# Patient Record
Sex: Male | Born: 1980 | Race: Black or African American | Hispanic: No | Marital: Single | Smoking: Current every day smoker
Health system: Southern US, Community
[De-identification: ages and names within clinical notes are randomized; demographics above are authoritative.]

## PROBLEM LIST (undated history)

## (undated) HISTORY — PX: FOOT SURGERY: SHX648

---

## 2011-08-20 ENCOUNTER — Emergency Department (INDEPENDENT_AMBULATORY_CARE_PROVIDER_SITE_OTHER)
Admission: EM | Admit: 2011-08-20 | Discharge: 2011-08-20 | Disposition: A | Discharge status: Home Health | Payer: Self-pay | Source: Home / Self Care | Attending: Family Medicine | Admitting: Family Medicine

## 2011-08-20 ENCOUNTER — Encounter (HOSPITAL_COMMUNITY): Payer: Self-pay

## 2011-08-20 DIAGNOSIS — S239XXA Sprain of unspecified parts of thorax, initial encounter: Secondary | ICD-10-CM

## 2011-08-20 DIAGNOSIS — S29012A Strain of muscle and tendon of back wall of thorax, initial encounter: Secondary | ICD-10-CM

## 2011-08-20 MED ORDER — CYCLOBENZAPRINE HCL 5 MG PO TABS: 5.0000 mg | ORAL_TABLET | Freq: Three times a day (TID) | ORAL | Status: AC | PRN

## 2011-08-20 MED ORDER — IBUPROFEN 800 MG PO TABS: 800.0000 mg | ORAL_TABLET | Freq: Three times a day (TID) | ORAL | Status: AC

## 2011-08-20 NOTE — ED Notes (Signed)
Pt states has been having rt sided back pain since lifting items on 08-17-11.  States he has injured his back previously.

## 2011-08-20 NOTE — ED Provider Notes (Signed)
History     CSN: [id number]  Arrival date & time 08/20/11  1155   First MD Initiated Contact with Patient 08/20/11 1257      Chief Complaint  Patient presents with  . Back Pain    (Consider location/radiation/quality/duration/timing/severity/associated sxs/prior treatment) Patient is a 31 y.o. male presenting with back pain. The history is provided by the patient.  Back Pain  This is a new problem. The current episode started 2 days ago. The problem has not changed since onset.The pain is associated with lifting heavy objects. The pain is present in the lumbar spine. The quality of the pain is described as stabbing. The symptoms are aggravated by bending, twisting and certain positions. Pertinent negatives include no chest pain, no numbness, no abdominal pain, no abdominal swelling, no bowel incontinence, no dysuria, no paresthesias, no tingling and no weakness. Risk factors include obesity.    History reviewed. No pertinent past medical history.  History reviewed. No pertinent past surgical history.  No family history on file.  History  Substance Use Topics  . Smoking status: Current Everyday Smoker -- 1.0 packs/day  . Smokeless tobacco: Not on file  . Alcohol Use: Yes      Review of Systems  Constitutional: Negative.   Cardiovascular: Negative for chest pain.  Gastrointestinal: Negative.  Negative for abdominal pain and bowel incontinence.  Genitourinary: Negative for dysuria.  Musculoskeletal: Positive for back pain. Negative for arthralgias and gait problem.  Neurological: Negative for tingling, weakness, numbness and paresthesias.    Allergies  Review of patient's allergies indicates no known allergies.  Home Medications   Current Outpatient Rx  Name Route Sig Dispense Refill  . CYCLOBENZAPRINE HCL 5 MG PO TABS Oral Take 1 tablet (5 mg total) by mouth 3 (three) times daily as needed for muscle spasms. 30 tablet 0  . IBUPROFEN 800 MG PO TABS Oral Take 1 tablet  (800 mg total) by mouth 3 (three) times daily. 30 tablet 0    BP 103/68  Pulse 82  Temp(Src) 98 F (36.7 C) (Oral)  Resp 16  SpO2 99%  Physical Exam  Nursing note and vitals reviewed. Constitutional: He is oriented to person, place, and time. He appears well-developed and well-nourished.  HENT:  Head: Normocephalic.  Abdominal: Soft. Bowel sounds are normal. There is no tenderness.  Musculoskeletal:       Back:  Neurological: He is alert and oriented to person, place, and time.  Skin: Skin is warm and dry.    ED Course  Procedures (including critical care time)  Labs Reviewed - No data to display No results found.   1. Strain of mid-back       MDM          Barkley Bruns, MD 08/20/11 [phone]

## 2011-08-23 ENCOUNTER — Encounter (HOSPITAL_COMMUNITY): Payer: Self-pay | Admitting: *Deleted

## 2011-08-23 ENCOUNTER — Emergency Department (HOSPITAL_COMMUNITY)
Admission: EM | Admit: 2011-08-23 | Discharge: 2011-08-23 | Disposition: A | Discharge status: Home Health | Payer: Self-pay | Attending: Emergency Medicine | Admitting: Emergency Medicine

## 2011-08-23 DIAGNOSIS — Z79899 Other long term (current) drug therapy: Secondary | ICD-10-CM | POA: Insufficient documentation

## 2011-08-23 DIAGNOSIS — K089 Disorder of teeth and supporting structures, unspecified: Secondary | ICD-10-CM | POA: Insufficient documentation

## 2011-08-23 DIAGNOSIS — K0889 Other specified disorders of teeth and supporting structures: Secondary | ICD-10-CM

## 2011-08-23 DIAGNOSIS — R6884 Jaw pain: Secondary | ICD-10-CM | POA: Insufficient documentation

## 2011-08-23 DIAGNOSIS — F17200 Nicotine dependence, unspecified, uncomplicated: Secondary | ICD-10-CM | POA: Insufficient documentation

## 2011-08-23 DIAGNOSIS — H9209 Otalgia, unspecified ear: Secondary | ICD-10-CM | POA: Insufficient documentation

## 2011-08-23 MED ORDER — TRAMADOL HCL 50 MG PO TABS: 50.0000 mg | ORAL_TABLET | Freq: Three times a day (TID) | ORAL | Status: AC | PRN

## 2011-08-23 MED ORDER — PENICILLIN V POTASSIUM 500 MG PO TABS: 500.0000 mg | ORAL_TABLET | Freq: Three times a day (TID) | ORAL | Status: AC

## 2011-08-23 MED ORDER — TRAMADOL HCL 50 MG PO TABS
50.0000 mg | ORAL_TABLET | Freq: Once | ORAL | Status: AC
  Administered 2011-08-23: 50 mg via ORAL
  Filled 2011-08-23: qty 1

## 2011-08-23 NOTE — ED Provider Notes (Signed)
History     CSN: [id number]  Arrival date & time 08/23/11  7829   First MD Initiated Contact with Patient 08/23/11 1033      Chief Complaint  Patient presents with  . Dental Pain  . Otalgia    (Consider location/radiation/quality/duration/timing/severity/associated sxs/prior treatment) Patient is a 31 y.o. male presenting with tooth pain and ear pain. The history is provided by the patient.  Dental PainThe primary symptoms include mouth pain. The symptoms began 2 days ago. The symptoms are worsening. The symptoms are new. The symptoms occur constantly.  Additional symptoms include: ear pain. Additional symptoms do not include: facial swelling. Associated symptoms comments: Jaw pain on left radiating into left ear. No fever..   Otalgia Pertinent negatives include no vomiting.    History reviewed. No pertinent past medical history.  History reviewed. No pertinent past surgical history.  No family history on file.  History  Substance Use Topics  . Smoking status: Current Everyday Smoker -- 1.0 packs/day  . Smokeless tobacco: Not on file  . Alcohol Use: Yes      Review of Systems  Constitutional: Negative.   HENT: Positive for ear pain and dental problem. Negative for facial swelling.   Eyes: Negative.   Gastrointestinal: Negative.  Negative for nausea and vomiting.  Skin: Negative.     Allergies  Review of patient's allergies indicates no known allergies.  Home Medications   Current Outpatient Rx  Name Route Sig Dispense Refill  . CYCLOBENZAPRINE HCL 5 MG PO TABS Oral Take 1 tablet (5 mg total) by mouth 3 (three) times daily as needed for muscle spasms. 30 tablet 0  . IBUPROFEN 800 MG PO TABS Oral Take 1 tablet (800 mg total) by mouth 3 (three) times daily. 30 tablet 0    BP 151/91  Pulse 104  Temp(Src) 99.9 F (37.7 C) (Oral)  Resp 24  SpO2 99%  Physical Exam  Constitutional: He is oriented to person, place, and time. He appears well-developed and  well-nourished.  HENT:       Generally good dentition. Tender to lower alveolar ridge without palpable abscess. Left ear unremarkable in appearance. No cervical lymphadenopathy.  Neck: Normal range of motion.  Pulmonary/Chest: Effort normal.  Musculoskeletal: Normal range of motion.  Neurological: He is alert and oriented to person, place, and time.  Skin: Skin is warm and dry.  Psychiatric: He has a normal mood and affect.    ED Course  Procedures (including critical care time)  Labs Reviewed - No data to display No results found.   No diagnosis found.    MDM          Rodena Medin, PA-C 08/23/11 1121

## 2011-08-23 NOTE — Discharge Instructions (Signed)
FOLLOW UP WITH THE DENTIST OF YOUR CHOICE FOR FURTHER MANAGEMENT OF DENTAL PAIN. CONTINUE IBUPROFEN 800 MG GIVEN TO YOU ON Friday, MARCH1ST. TAKE ULTRAM FOR ADDITIONAL PAIN CONTROL.   Dental Pain A tooth ache may be caused by cavities (tooth decay). Cavities expose the nerve of the tooth to air and hot or cold temperatures. It may come from an infection or abscess (also called a boil or furuncle) around your tooth. It is also often caused by dental caries (tooth decay). This causes the pain you are having. DIAGNOSIS  Your caregiver can diagnose this problem by exam. TREATMENT   If caused by an infection, it may be treated with medications which kill germs (antibiotics) and pain medications as prescribed by your caregiver. Take medications as directed.   Only take over-the-counter or prescription medicines for pain, discomfort, or fever as directed by your caregiver.   Whether the tooth ache today is caused by infection or dental disease, you should see your dentist as soon as possible for further care.  SEEK MEDICAL CARE IF: The exam and treatment you received today has been provided on an emergency basis only. This is not a substitute for complete medical or dental care. If your problem worsens or new problems (symptoms) appear, and you are unable to meet with your dentist, call or return to this location. SEEK IMMEDIATE MEDICAL CARE IF:   You have a fever.   You develop redness and swelling of your face, jaw, or neck.   You are unable to open your mouth.   You have severe pain uncontrolled by pain medicine.  MAKE SURE YOU:   Understand these instructions.   Will watch your condition.   Will get help right away if you are not doing well or get worse.  Document Released: 06/07/2005 Document Revised: 05/27/2011 Document Reviewed: 01/24/2008 [facility] Patient Information 2012 [place], [place].

## 2011-08-23 NOTE — ED Notes (Signed)
Patient reports onset of toothache on Friday on the left side.  He is now having ear pain on same side.  Patient has some swelling noted to the left side of his face

## 2011-08-23 NOTE — ED Provider Notes (Signed)
Medical screening examination/treatment/procedure(s) were performed by non-physician practitioner and as supervising physician I was immediately available for consultation/collaboration.  Doug Sou, MD 08/23/11 1744

## 2011-10-02 ENCOUNTER — Emergency Department (HOSPITAL_COMMUNITY)
Admission: EM | Admit: 2011-10-02 | Discharge: 2011-10-02 | Disposition: A | Discharge status: Home / Self Care | Payer: Self-pay | Attending: Emergency Medicine | Admitting: Emergency Medicine

## 2011-10-02 ENCOUNTER — Encounter (HOSPITAL_COMMUNITY): Payer: Self-pay

## 2011-10-02 DIAGNOSIS — F17200 Nicotine dependence, unspecified, uncomplicated: Secondary | ICD-10-CM | POA: Insufficient documentation

## 2011-10-02 DIAGNOSIS — K047 Periapical abscess without sinus: Secondary | ICD-10-CM | POA: Insufficient documentation

## 2011-10-02 MED ORDER — OXYCODONE-ACETAMINOPHEN 5-325 MG PO TABS
1.0000 | ORAL_TABLET | Freq: Once | ORAL | Status: AC
  Administered 2011-10-02: 1 via ORAL
  Filled 2011-10-02: qty 1

## 2011-10-02 MED ORDER — OXYCODONE-ACETAMINOPHEN 5-325 MG PO TABS: 1.0000 | ORAL_TABLET | Freq: Four times a day (QID) | ORAL | Status: AC | PRN

## 2011-10-02 MED ORDER — PENICILLIN V POTASSIUM 500 MG PO TABS: 500.0000 mg | ORAL_TABLET | Freq: Four times a day (QID) | ORAL | Status: AC

## 2011-10-02 NOTE — Discharge Instructions (Signed)
Dental Abscess A dental abscess usually starts from an infected tooth. Antibiotic medicine and pain pills can be helpful, but dental infections require the attention of a dentist. Rinse around the infected area often with salt water (a pinch of salt in 8 oz of warm water). Do not apply heat to the outside of your face. See your dentist or oral surgeon as soon as possible.  SEEK IMMEDIATE MEDICAL CARE IF:  You have increasing, severe pain that is not relieved by medicine.   You or your child has an oral temperature above 102 F (38.9 C), not controlled by medicine.   Your baby is older than 3 months with a rectal temperature of 102 F (38.9 C) or higher.   Your baby is 3 months old or younger with a rectal temperature of 100.4 F (38 C) or higher.   You develop chills, severe headache, difficulty breathing, or trouble swallowing.   You have swelling in the neck or around the eye.  Document Released: 06/07/2005 Document Revised: 05/27/2011 Document Reviewed: 11/16/2006 [employer] Patient Information 2012 [employer]. 

## 2011-10-02 NOTE — ED Provider Notes (Signed)
History     CSN: [id number]  Arrival date & time 10/02/11  1032   First MD Initiated Contact with Patient 10/02/11 1104      Chief Complaint  Patient presents with  . Facial Swelling    (Consider location/radiation/quality/duration/timing/severity/associated sxs/prior treatment) The history is provided by the patient.   patient was left facial pain and swelling from a bad tooth. Begin this Thursday, but has been seen in ER for the same previously. He states he was on penicillin, but did not finish it. No fevers. No trauma. He states he does not have a dentist.  No past medical history on file.  History reviewed. No pertinent past surgical history.  No family history on file.  History  Substance Use Topics  . Smoking status: Current Everyday Smoker -- 1.0 packs/day  . Smokeless tobacco: Not on file  . Alcohol Use: Yes      Review of Systems  Constitutional: Negative for fever and fatigue.  HENT: Positive for dental problem. Negative for sore throat and voice change.   Respiratory: Negative for shortness of breath.     Allergies  Review of patient's allergies indicates no known allergies.  Home Medications   Current Outpatient Rx  Name Route Sig Dispense Refill  . OXYCODONE-ACETAMINOPHEN 5-325 MG PO TABS Oral Take 1-2 tablets by mouth every 6 (six) hours as needed for pain. 10 tablet 0  . PENICILLIN V POTASSIUM 500 MG PO TABS Oral Take 1 tablet (500 mg total) by mouth 4 (four) times daily. 40 tablet 0    BP 133/80  Pulse 89  Temp(Src) 98.2 F (36.8 C) (Oral)  Resp 18  SpO2 99%  Physical Exam  Constitutional: He appears well-developed.  HENT:       Swelling and fluctuance the left lower jaw laterally at approximately the fifth tooth. Poor dentition a broken off teeth at the gumline posterior to this. No swelling of floor of mouth. No skin changes    ED Course  Dental Date/Time: 10/02/2011 11:27 AM Performed by: Benjiman Core R. Authorized by:  Billee Cashing Consent: Verbal consent obtained. Risks and benefits: risks, benefits and alternatives were discussed Consent given by: patient Patient understanding: patient states understanding of the procedure being performed Patient consent: the patient's understanding of the procedure matches consent given Procedure consent: procedure consent matches procedure scheduled Relevant documents: relevant documents present and verified Test results: test results available and properly labeled Site marked: the operative site was marked Imaging studies: imaging studies not available Required items: required blood products, implants, devices, and special equipment available Patient identity confirmed: verbally with patient and arm band Time out: Immediately prior to procedure a "time out" was called to verify the correct patient, procedure, equipment, support staff and site/side marked as required. Local anesthesia used: yes Anesthesia: local infiltration Local anesthetic: lidocaine 2% with epinephrine Anesthetic total: 1 ml Patient sedated: no Patient tolerance: Patient tolerated the procedure well with no immediate complications. Comments: Dental abscess was anesthetized using lidocaine with epinephrine. A single puncture wound is made with an 11 blade scalpel. Purulent drainage was expressed. No complications   (including critical care time)  Labs Reviewed - No data to display No results found.   1. Dental abscess       MDM  Patient with a dental abscess. Has been on antibiotics, but states he did not finish them. Abscess was drained. He'll followup with a dentist.        Juliet Rude. Rubin Payor, MD 10/02/11 1130

## 2011-10-02 NOTE — ED Notes (Signed)
Lt. Lower dental pain with swelling.  Began on Thursday.

## 2012-01-08 ENCOUNTER — Encounter (HOSPITAL_COMMUNITY): Payer: Self-pay | Admitting: *Deleted

## 2012-01-08 ENCOUNTER — Emergency Department (HOSPITAL_COMMUNITY)
Admission: EM | Admit: 2012-01-08 | Discharge: 2012-01-08 | Disposition: A | Discharge status: Home / Self Care | Payer: Self-pay | Attending: Emergency Medicine | Admitting: Emergency Medicine

## 2012-01-08 DIAGNOSIS — F17200 Nicotine dependence, unspecified, uncomplicated: Secondary | ICD-10-CM | POA: Insufficient documentation

## 2012-01-08 DIAGNOSIS — J020 Streptococcal pharyngitis: Secondary | ICD-10-CM | POA: Insufficient documentation

## 2012-01-08 DIAGNOSIS — J069 Acute upper respiratory infection, unspecified: Secondary | ICD-10-CM

## 2012-01-08 MED ORDER — AZITHROMYCIN 250 MG PO TABS: 250.0000 mg | ORAL_TABLET | Freq: Every day | ORAL | Status: AC

## 2012-01-08 NOTE — ED Notes (Signed)
Reports sore throat x 2 days, causing sob. Airway is intact at triage.

## 2012-01-08 NOTE — ED Provider Notes (Signed)
History  Scribed for Edwin Jakes, MD, the patient was seen in room TR08C/TR08C. This chart was scribed by Edwin Palmer. The patient's care started at 11:48 AM     CSN: [id number]  Arrival date & time 01/08/12  1045   First MD Initiated Contact with Patient 01/08/12 1125      Chief Complaint  Patient presents with  . Sore Throat     The history is provided by the patient.   Edwin Palmer is a 31 y.o. male who presents to the Emergency Department complaining of sore throat that started two days ago.  Pt states that he is also experiencing SOB, productive cough, congestion, nausea, vomiting, diarrhea which he reports occurs twice a day.  He denies fever, dysuria, or abdominal pain.  Pt reports he has been in contact with someone who was dx with strep throat.  Nothing seems to make the sx better or worse.    History reviewed. No pertinent past medical history.  History reviewed. No pertinent past surgical history.  History reviewed. No pertinent family history.  History  Substance Use Topics  . Smoking status: Current Everyday Smoker -- 1.0 packs/day  . Smokeless tobacco: Not on file  . Alcohol Use: Yes      Review of Systems  Constitutional: Negative for fever.  HENT: Positive for congestion and sore throat.   Eyes: Negative for visual disturbance.  Respiratory: Positive for cough and shortness of breath.   Cardiovascular: Negative for chest pain.  Gastrointestinal: Positive for nausea, vomiting and diarrhea. Negative for abdominal pain.  Genitourinary: Negative for dysuria.  Skin: Negative for rash.  All other systems reviewed and are negative.    Allergies  Review of patient's allergies indicates no known allergies.  Home Medications   Current Outpatient Rx  Name Route Sig Dispense Refill  . IBUPROFEN 200 MG PO TABS Oral Take 200 mg by mouth every 6 (six) hours as needed. For pain    . AZITHROMYCIN 250 MG PO TABS Oral Take 1 tablet (250 mg total) by  mouth daily. Take first 2 tablets together, then 1 every day until finished. 6 tablet 0    BP 129/68  Pulse 92  Temp 98.3 F (36.8 C) (Oral)  Resp 20  SpO2 98%  Physical Exam  Nursing note and vitals reviewed. Constitutional: He is oriented to person, place, and time. He appears well-developed and well-nourished. No distress.  HENT:  Head: Normocephalic and atraumatic.  Mouth/Throat: Oropharyngeal exudate present.       Throat erythematous.   Eyes: EOM are normal. Pupils are equal, round, and reactive to light. No scleral icterus.  Cardiovascular: Normal rate and regular rhythm.   Pulmonary/Chest: Breath sounds normal.  Abdominal: There is no tenderness.  Musculoskeletal: Normal range of motion. He exhibits no tenderness.  Lymphadenopathy:    He has no cervical adenopathy.  Neurological: He is alert and oriented to person, place, and time.  Skin: Skin is warm and dry. No rash noted. He is not diaphoretic.  Psychiatric: He has a normal mood and affect. His behavior is normal.    ED Course  Procedures  DIAGNOSTIC STUDIES: Oxygen Saturation is 98% on room air, normal by my interpretation.    COORDINATION OF CARE:  11:02AM Ordered: Rapid strep screen   Labs Reviewed  RAPID STREP SCREEN - Abnormal; Notable for the following:    Streptococcus, Group A Screen (Direct) POSITIVE (*)     All other components within normal limits   No results  found.   1. Strep pharyngitis   2. Upper respiratory infection       MDM  Patient's strep test positive also patient with some shortness of breath productive cough we'll treat with Zithromax in case it's a pneumonia based illness as well. 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  I personally performed the services described in this documentation, which was scribed in my presence. The recorded information has been reviewed and considered.         Edwin Jakes, MD 01/08/12 [phone]

## 2012-01-18 ENCOUNTER — Encounter (HOSPITAL_COMMUNITY): Payer: Self-pay | Admitting: Emergency Medicine

## 2012-01-18 ENCOUNTER — Emergency Department (HOSPITAL_COMMUNITY)
Admission: EM | Admit: 2012-01-18 | Discharge: 2012-01-18 | Disposition: A | Discharge status: Home Health | Payer: Self-pay | Attending: Emergency Medicine | Admitting: Emergency Medicine

## 2012-01-18 DIAGNOSIS — J029 Acute pharyngitis, unspecified: Secondary | ICD-10-CM | POA: Insufficient documentation

## 2012-01-18 DIAGNOSIS — F17200 Nicotine dependence, unspecified, uncomplicated: Secondary | ICD-10-CM | POA: Insufficient documentation

## 2012-01-18 MED ORDER — PENICILLIN G BENZATHINE 1200000 UNIT/2ML IM SUSP
1.2000 10*6.[IU] | Freq: Once | INTRAMUSCULAR | Status: AC
  Administered 2012-01-18: 1.2 10*6.[IU] via INTRAMUSCULAR
  Filled 2012-01-18: qty 2

## 2012-01-18 MED ORDER — DEXAMETHASONE SODIUM PHOSPHATE 10 MG/ML IJ SOLN
10.0000 mg | Freq: Once | INTRAMUSCULAR | Status: AC
  Administered 2012-01-18: 10 mg
  Filled 2012-01-18: qty 1

## 2012-01-18 NOTE — ED Notes (Signed)
Pt st's he was dx here last week with strep throat and was given Rx for same. St's he could not get it filled and now pain is worse and in his left ear.

## 2012-01-18 NOTE — ED Provider Notes (Signed)
History    This chart was scribed for Raeford Razor, MD, MD by Smitty Pluck. The patient was seen in room TR11C and the patient's care was started at 4:27PM.   CSN: [id number]  Arrival date & time 01/18/12  1532   First MD Initiated Contact with Patient 01/18/12 1622      Chief Complaint  Patient presents with  . Sore Throat    (Consider location/radiation/quality/duration/timing/severity/associated sxs/prior treatment) The history is provided by the patient.   Edwin Palmer is a 31 y.o. male who presents to the Emergency Department complaining of constant, moderate sore throat and left ear pain onset 2 weeks ago. Pt reports that he was given prescription for sore throat at doctor's office but he has not been able to get the prescription filled due to cost. Pt reports that he has pain with swallowing. Pt denies fevers, chills, nausea, vomiting and diarrhea.   History reviewed. No pertinent past medical history.  History reviewed. No pertinent past surgical history.  No family history on file.  History  Substance Use Topics  . Smoking status: Current Everyday Smoker -- 1.0 packs/day  . Smokeless tobacco: Not on file  . Alcohol Use: Yes      Review of Systems  All other systems reviewed and are negative.   10 Systems reviewed and all are negative for acute change except as noted in the HPI.   Allergies  Review of patient's allergies indicates no known allergies.  Home Medications   Current Outpatient Rx  Name Route Sig Dispense Refill  . IBUPROFEN 200 MG PO TABS Oral Take 600-800 mg by mouth every 6 (six) hours as needed. For pain      BP 137/70  Pulse 69  Temp 98.8 F (37.1 C) (Oral)  Resp 18  SpO2 99%  Physical Exam  Nursing note and vitals reviewed. Constitutional: He appears well-developed and well-nourished. No distress.  HENT:  Head: Normocephalic and atraumatic.  Right Ear: External ear normal.  Left Ear: External ear normal.  Mouth/Throat: Uvula  is midline. Posterior oropharyngeal erythema (mildly) present. No oropharyngeal exudate.  Eyes: Conjunctivae are normal. Right eye exhibits no discharge. Left eye exhibits no discharge.  Neck: Neck supple.  Cardiovascular: Normal rate, regular rhythm and normal heart sounds.  Exam reveals no gallop and no friction rub.   No murmur heard. Pulmonary/Chest: Effort normal and breath sounds normal. No respiratory distress.  Musculoskeletal: He exhibits no edema and no tenderness.  Neurological: He is alert.  Skin: Skin is warm and dry.  Psychiatric: He has a normal mood and affect. His behavior is normal. Thought content normal.    ED Course  Procedures (including critical care time) DIAGNOSTIC STUDIES: Oxygen Saturation is 99% on room air, normal by my interpretation.    COORDINATION OF CARE: 4:34PM EDP discusses pt ED treatment with pt  4:34PM EDP ordered medication:  Scheduled Meds:   . dexamethasone  10 mg Other Once  . penicillin g benzathine (BICILLIN-LA) IM  1.2 Million Units Intramuscular Once   Continuous Infusions:  PRN Meds:.    Labs Reviewed - No data to display No results found.   1. Pharyngitis       MDM  31yM with sore throat. Recent diagnosis of strep could not fill abx. Dose bicillin given. Continued symptomatic tx. No evidence of deep space infection today. Return precautions discussed.  I personally preformed the services scribed in my presence. The recorded information has been reviewed and considered. Raeford Razor, MD.  Raeford Razor, MD 01/21/12 1316

## 2012-02-09 ENCOUNTER — Encounter (HOSPITAL_COMMUNITY): Payer: Self-pay | Admitting: *Deleted

## 2012-02-09 ENCOUNTER — Emergency Department (HOSPITAL_COMMUNITY)
Admission: EM | Admit: 2012-02-09 | Discharge: 2012-02-09 | Disposition: A | Discharge status: Home / Self Care | Payer: No Typology Code available for payment source | Attending: Emergency Medicine | Admitting: Emergency Medicine

## 2012-02-09 DIAGNOSIS — F17200 Nicotine dependence, unspecified, uncomplicated: Secondary | ICD-10-CM | POA: Insufficient documentation

## 2012-02-09 DIAGNOSIS — R51 Headache: Secondary | ICD-10-CM | POA: Insufficient documentation

## 2012-02-09 DIAGNOSIS — Z77098 Contact with and (suspected) exposure to other hazardous, chiefly nonmedicinal, chemicals: Secondary | ICD-10-CM | POA: Insufficient documentation

## 2012-02-09 MED ORDER — ACETAMINOPHEN 325 MG PO TABS
650.0000 mg | ORAL_TABLET | Freq: Once | ORAL | Status: AC
  Administered 2012-02-09: 650 mg via ORAL
  Filled 2012-02-09: qty 2
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
## 2012-09-14 ENCOUNTER — Encounter (HOSPITAL_COMMUNITY): Payer: Self-pay | Admitting: Adult Health

## 2012-09-14 ENCOUNTER — Emergency Department (HOSPITAL_COMMUNITY)
Admission: EM | Admit: 2012-09-14 | Discharge: 2012-09-15 | Disposition: A | Discharge status: Home / Self Care | Payer: Self-pay | Attending: Emergency Medicine | Admitting: Emergency Medicine

## 2012-09-14 DIAGNOSIS — M545 Low back pain, unspecified: Secondary | ICD-10-CM | POA: Insufficient documentation

## 2012-09-14 DIAGNOSIS — F17200 Nicotine dependence, unspecified, uncomplicated: Secondary | ICD-10-CM | POA: Insufficient documentation

## 2012-09-14 DIAGNOSIS — M549 Dorsalgia, unspecified: Secondary | ICD-10-CM

## 2012-09-14 NOTE — ED Notes (Signed)
Presents with 2 months of lower sharp back pain that is worse with changing positions and movement. Pain does not radiate. Rated 10/10. Pt is ambulatory and CMS intact. Nothing makes better

## 2012-09-15 ENCOUNTER — Telehealth (HOSPITAL_COMMUNITY): Payer: Self-pay | Admitting: Emergency Medicine

## 2012-09-15 MED ORDER — KETOROLAC TROMETHAMINE 60 MG/2ML IM SOLN
60.0000 mg | Freq: Once | INTRAMUSCULAR | Status: AC
  Administered 2012-09-15: 60 mg via INTRAMUSCULAR
  Filled 2012-09-15: qty 2

## 2012-09-15 MED ORDER — HYDROCODONE-ACETAMINOPHEN 5-325 MG PO TABS: 1.0000 | ORAL_TABLET | ORAL | Status: DC | PRN

## 2012-09-15 MED ORDER — HYDROCODONE-ACETAMINOPHEN 5-325 MG PO TABS
1.0000 | ORAL_TABLET | Freq: Once | ORAL | Status: AC
  Administered 2012-09-15: 1 via ORAL
  Filled 2012-09-15: qty 1

## 2012-09-15 NOTE — ED Notes (Signed)
Patient is alert and orientedx4.  Patient was explained discharge instructions and they understood them with no questions.  The patient's significant other, Steward Drone is driving the patient home.

## 2012-09-15 NOTE — ED Provider Notes (Signed)
Medical screening examination/treatment/procedure(s) were performed by non-physician practitioner and as supervising physician I was immediately available for consultation/collaboration.  Petrea Fredenburg K Colie Fugitt, MD 09/15/12 0805 

## 2012-09-15 NOTE — ED Provider Notes (Signed)
History     CSN: [id number]  Arrival date & time 09/14/12  2213   First MD Initiated Contact with Patient 09/14/12 2358      Chief Complaint  Patient presents with  . Back Pain    (Consider location/radiation/quality/duration/timing/severity/associated sxs/prior treatment) HPI History provided by pt.   Pt has had daily, non-radiating, shooting pain in left low back for several months.  Stable but sick of it today.  Duration of sx variable and seems to occur most when he's sitting.  No relief w/ ibuprofen.  No associated fever, LE weakness/paresthesias or bladder/bowel dysfunction.  Denies trauma.  Has never been evaluated for this pain.   History reviewed. No pertinent past medical history.  History reviewed. No pertinent past surgical history.  History reviewed. No pertinent family history.  History  Substance Use Topics  . Smoking status: Current Every Day Smoker -- 1.00 packs/day  . Smokeless tobacco: Not on file  . Alcohol Use: Yes      Review of Systems  Musculoskeletal:       Chronic right ankle laxity.  No h/o sprain/fx.  All other systems reviewed and are negative.    Allergies  Review of patient's allergies indicates no known allergies.  Home Medications   Current Outpatient Rx  Name  Route  Sig  Dispense  Refill  . HYDROcodone-acetaminophen (NORCO/VICODIN) 5-325 MG per tablet   Oral   Take 1 tablet by mouth every 4 (four) hours as needed for pain.   20 tablet   0     BP 120/71  Pulse 76  Temp(Src) 98.7 F (37.1 C) (Oral)  Resp 16  SpO2 99%  Physical Exam  Nursing note and vitals reviewed. Constitutional: He is oriented to person, place, and time. He appears well-developed and well-nourished.  HENT:  Head: Normocephalic and atraumatic.  Eyes:  Normal appearance  Neck: Normal range of motion.  Cardiovascular: Normal rate and regular rhythm.   Pulmonary/Chest: Effort normal and breath sounds normal.  Genitourinary:  No CVA ttp   Musculoskeletal:  Lumbar spinal non-tender.  L lumbar paraspinal ttp. Full active ROM of LE but pain aggravated by L hip flexion. Nml patellar reflexes.  No saddle anesthesia. Distal sensation intact.  2+ DP pulses.  Ambulates w/out diffulty.   Neurological: He is alert and oriented to person, place, and time.  Skin: Skin is warm and dry. No rash noted.  Psychiatric: He has a normal mood and affect. His behavior is normal.    ED Course  Procedures (including critical care time)  Labs Reviewed - No data to display No results found.   1. Back pain       MDM  Healthy 31yo M presents w/ chronic left low back pain.  Sx stable but he is "sick of it' today.  Has never been evaluated for this pain.  On exam, afebrile, lumbar paraspinal tenderness, no NV deficits of LEs.  Treated w/ IM toradol and 1 po vicodin, d/c'd home w/ ibuprofen and vicodin, as well as referral to NS for persistent sx.  Referred to ortho for chronic, non-traumatic R ankle laxity as well.  Return precautions discussed. 1:37 AM         Otilio Miu, PA-C 09/15/12 [phone]

## 2012-09-15 NOTE — ED Notes (Signed)
Patient says he has been having lower back pain for about one year.  Tonight he came in because he cannot stand the pain anymore.  The patient says he is not hurting when he pees but his urine is cloudy and has an odor to it.  He denies injury to his back.

## 2013-01-30 ENCOUNTER — Emergency Department (HOSPITAL_COMMUNITY)
Admission: EM | Admit: 2013-01-30 | Discharge: 2013-01-30 | Disposition: A | Discharge status: Home / Self Care | Payer: Self-pay | Attending: Emergency Medicine | Admitting: Emergency Medicine

## 2013-01-30 ENCOUNTER — Encounter (HOSPITAL_COMMUNITY): Payer: Self-pay | Admitting: Emergency Medicine

## 2013-01-30 ENCOUNTER — Emergency Department (HOSPITAL_COMMUNITY): Payer: Self-pay

## 2013-01-30 DIAGNOSIS — R1012 Left upper quadrant pain: Secondary | ICD-10-CM | POA: Insufficient documentation

## 2013-01-30 DIAGNOSIS — R110 Nausea: Secondary | ICD-10-CM | POA: Insufficient documentation

## 2013-01-30 DIAGNOSIS — F17200 Nicotine dependence, unspecified, uncomplicated: Secondary | ICD-10-CM | POA: Insufficient documentation

## 2013-01-30 DIAGNOSIS — G8929 Other chronic pain: Secondary | ICD-10-CM | POA: Insufficient documentation

## 2013-01-30 LAB — URINALYSIS, ROUTINE W REFLEX MICROSCOPIC
Glucose, UA: NEGATIVE mg/dL
Leukocytes, UA: NEGATIVE
Protein, ur: NEGATIVE mg/dL
Specific Gravity, Urine: 1.029 (ref 1.005–1.030)
pH: 6 (ref 5.0–8.0)

## 2013-01-30 LAB — CBC WITH DIFFERENTIAL/PLATELET
Basophils Absolute: 0 10*3/uL (ref 0.0–0.1)
HCT: 40.1 % (ref 39.0–52.0)
Hemoglobin: 14.4 g/dL (ref 13.0–17.0)
Lymphocytes Relative: 42 % (ref 12–46)
Monocytes Absolute: 0.5 10*3/uL (ref 0.1–1.0)
Neutro Abs: 3.4 10*3/uL (ref 1.7–7.7)
RDW: 14.5 % (ref 11.5–15.5)
WBC: 7 10*3/uL (ref 4.0–10.5)

## 2013-01-30 LAB — COMPREHENSIVE METABOLIC PANEL
ALT: 39 U/L (ref 0–53)
AST: 21 U/L (ref 0–37)
Alkaline Phosphatase: 93 U/L (ref 39–117)
CO2: 25 mEq/L (ref 19–32)
Chloride: 103 mEq/L (ref 96–112)
Creatinine, Ser: 1 mg/dL (ref 0.50–1.35)
GFR calc non Af Amer: >90 mL/min (ref >90)
Sodium: 140 mEq/L (ref 135–145)
Total Bilirubin: 0.3 mg/dL (ref 0.3–1.2)

## 2013-01-30 MED ORDER — FAMOTIDINE 20 MG PO TABS: 20.0000 mg | ORAL_TABLET | Freq: Two times a day (BID) | ORAL | Status: DC

## 2013-01-30 MED ORDER — IBUPROFEN 800 MG PO TABS
800.0000 mg | ORAL_TABLET | Freq: Once | ORAL | Status: AC
  Administered 2013-01-30: 800 mg via ORAL
  Filled 2013-01-30: qty 1

## 2013-01-30 MED ORDER — GI COCKTAIL ~~LOC~~
30.0000 mL | Freq: Once | ORAL | Status: AC
  Administered 2013-01-30: 30 mL via ORAL
  Filled 2013-01-30: qty 30

## 2013-01-30 NOTE — ED Notes (Signed)
Pt states that he has had abdominal pain for 2 years. He has never gone to the doctor for this. He states that it is achy and constantly bubbling.

## 2013-01-30 NOTE — ED Notes (Signed)
PT. REPORTS LEFT UPPER ABDOMINAL PAIN FOR 2 YEARS WITH OCCASIONAL NAUSEA , DENIES INJURY , NO EMESIS OR DIARRHEA.

## 2013-01-30 NOTE — ED Provider Notes (Signed)
CSN: [id number]     Arrival date & time 01/30/13  0134 History     First MD Initiated Contact with Patient 01/30/13 0340     Chief Complaint  Patient presents with  . Abdominal Pain   HPI Edwin Palmer is a 32 y.o. male presenting with left upper quadrant abdominal pain is sharp, intermittent, occasionally associated with nausea, moderately severe, no vomiting, no diarrhea, no fevers or chills. Patient denies any antecedent trauma, is not on anything that makes it worse or better. Has not taken any medicine for it. This is been going on for 2 years.   History reviewed. No pertinent past medical history. History reviewed. No pertinent past surgical history. No family history on file. History  Substance Use Topics  . Smoking status: Current Every Day Smoker -- 1.00 packs/day  . Smokeless tobacco: Not on file  . Alcohol Use: Yes    Review of Systems At least 10pt or greater review of systems completed and are negative except where specified in the HPI.  Allergies  Review of patient's allergies indicates no known allergies.  Home Medications  No current outpatient prescriptions on file. BP 108/49  Pulse 65  Temp(Src) 97.6 F (36.4 C) (Oral)  Resp 16  SpO2 96% Physical Exam  Nursing notes reviewed.  Electronic medical record reviewed. VITAL SIGNS:   Filed Vitals:   01/30/13 0140 01/30/13 0505  BP: 113/72 108/49  Pulse: 78 65  Temp: 98.1 F (36.7 C) 97.6 F (36.4 C)  TempSrc: Oral Oral  Resp: 14 16  SpO2: 97% 96%   CONSTITUTIONAL: Awake, oriented, appears non-toxic HENT: Atraumatic, normocephalic, oral mucosa pink and moist, airway patent. Nares patent without drainage. External ears normal. EYES: Conjunctiva clear, EOMI, PERRLA NECK: Trachea midline, non-tender, supple CARDIOVASCULAR: Normal heart rate, Normal rhythm, No murmurs, rubs, gallops PULMONARY/CHEST: Clear to auscultation, no rhonchi, wheezes, or rales. Symmetrical breath sounds. Non-tender. ABDOMINAL:  Non-distended, soft, non-tender - no rebound or guarding.  BS normal. NEUROLOGIC: Non-focal, moving all four extremities, no gross sensory or motor deficits. EXTREMITIES: No clubbing, cyanosis, or edema SKIN: Warm, Dry, No erythema, No rash  ED Course   Procedures (including critical care time)  Labs Reviewed  URINALYSIS, ROUTINE W REFLEX MICROSCOPIC - Abnormal; Notable for the following:    APPearance CLOUDY (*)    All other components within normal limits  COMPREHENSIVE METABOLIC PANEL - Abnormal; Notable for the following:    Glucose, Bld 103 (*)    All other components within normal limits  CBC WITH DIFFERENTIAL   Dg Abd Acute W/chest  01/30/2013   *RADIOLOGY REPORT*  Clinical Data: Shortness of breath.  Lower left chest pain. Nausea.  ACUTE ABDOMEN SERIES (ABDOMEN 2 VIEW & CHEST 1 VIEW)  Comparison: No priors.  Findings: Lung volumes are normal.  No consolidative airspace disease.  No pleural effusions.  No pneumothorax.  No pulmonary nodule or mass noted.  Pulmonary vasculature and the cardiomediastinal silhouette are within normal limits.  Gas and stool are seen scattered throughout the colon extending to the level of the distal rectum.  No pathologic distension of small bowel is noted.  No gross evidence of pneumoperitoneum.  IMPRESSION: 1.  Nonobstructive bowel gas pattern. 2.  No pneumoperitoneum. 3.  No radiographic evidence of acute cardiopulmonary disease.   Original Report Authenticated By: Trudie Reed, M.D.   1. LUQ pain   2. Nausea    Medications  gi cocktail (Maalox,Lidocaine,Donnatal) (30 mL Oral Given 01/30/13 0508)  ibuprofen (ADVIL,MOTRIN) tablet 800  mg (800 mg Oral Given 01/30/13 8657)     MDM  Patient presenting with chronic abdominal pain.  This sharp abdominal pain could be related to GI, the patient has had this pain for 2 years. Patient treated with ibuprofen. Laboratory workup and imaging unremarkable, patient's pain is much better on treatment, we'll  start the patient on Pepcid for possible GERD. Refer to [facility] for followup.  The patient appears reasonably screened and/or stabilized for discharge and I doubt any other medical condition or other [facility] requiring further screening, evaluation, or treatment in the ED exists or is present at this time prior to discharge.    Jones Skene, MD 01/31/13 8469

## 2013-03-22 ENCOUNTER — Emergency Department (HOSPITAL_COMMUNITY): Payer: No Typology Code available for payment source

## 2013-03-22 ENCOUNTER — Encounter (HOSPITAL_COMMUNITY): Payer: Self-pay | Admitting: Emergency Medicine

## 2013-03-22 ENCOUNTER — Emergency Department (HOSPITAL_COMMUNITY)
Admission: EM | Admit: 2013-03-22 | Discharge: 2013-03-23 | Disposition: A | Discharge status: Home / Self Care | Payer: Self-pay | Attending: Emergency Medicine | Admitting: Emergency Medicine

## 2013-03-22 DIAGNOSIS — M79674 Pain in right toe(s): Secondary | ICD-10-CM

## 2013-03-22 DIAGNOSIS — B353 Tinea pedis: Secondary | ICD-10-CM | POA: Insufficient documentation

## 2013-03-22 DIAGNOSIS — F17200 Nicotine dependence, unspecified, uncomplicated: Secondary | ICD-10-CM | POA: Insufficient documentation

## 2013-03-22 DIAGNOSIS — Z79899 Other long term (current) drug therapy: Secondary | ICD-10-CM | POA: Insufficient documentation

## 2013-03-22 DIAGNOSIS — G8911 Acute pain due to trauma: Secondary | ICD-10-CM | POA: Insufficient documentation

## 2013-03-22 DIAGNOSIS — M79609 Pain in unspecified limb: Secondary | ICD-10-CM | POA: Insufficient documentation

## 2013-03-22 MED ORDER — IBUPROFEN 800 MG PO TABS: 800.0000 mg | ORAL_TABLET | Freq: Three times a day (TID) | ORAL | Status: DC

## 2013-03-22 MED ORDER — TERBINAFINE HCL 1 % EX CREA: TOPICAL_CREAM | Freq: Two times a day (BID) | CUTANEOUS | Status: DC

## 2013-03-22 NOTE — ED Provider Notes (Signed)
CSN: [id number]     Arrival date & time 03/22/13  2238 History  This chart was scribed for non-physician practitioner Dierdre Forth, PA-C working with Candyce Churn, MD by Danella Maiers, ED Scribe. This patient was seen in room TR06C/TR06C and the patient's care was started at 10:50 PM.   Chief Complaint  Patient presents with  . Toe Injury   The history is provided by the patient. No language interpreter was used.   HPI Comments: Edwin Palmer is a 32 y.o. male who presents to the Emergency Department complaining of right third toe pain after stumbling one month ago and has worsened in the past week. He reports bruising to the area. He also reports a lot of itching to the area. He states resting used to make the pain go away but not in the past week. He has not tried anything OTC.  Pt reports that he wears steel toed work boots and does not change his socks during the day.  Nothing makes the pain better and palpation makes it worse.  History reviewed. No pertinent past medical history. History reviewed. No pertinent past surgical history. No family history on file. History  Substance Use Topics  . Smoking status: Current Every Day Smoker -- 1.00 packs/day  . Smokeless tobacco: Not on file  . Alcohol Use: Yes    Review of Systems  Constitutional: Negative for fever, diaphoresis, appetite change, fatigue and unexpected weight change.  HENT: Negative for mouth sores and neck stiffness.   Eyes: Negative for visual disturbance.  Respiratory: Negative for cough, chest tightness, shortness of breath and wheezing.   Cardiovascular: Negative for chest pain.  Gastrointestinal: Negative for nausea, vomiting, abdominal pain, diarrhea and constipation.  Endocrine: Negative for polydipsia, polyphagia and polyuria.  Genitourinary: Negative for dysuria, urgency, frequency and hematuria.  Musculoskeletal: Positive for arthralgias. Negative for back pain.  Skin: Negative for rash.   Allergic/Immunologic: Negative for immunocompromised state.  Neurological: Negative for syncope, light-headedness and headaches.  Hematological: Does not bruise/bleed easily.  Psychiatric/Behavioral: Negative for sleep disturbance. The patient is not nervous/anxious.     Allergies  Review of patient's allergies indicates no known allergies.  Home Medications   Current Outpatient Rx  Name  Route  Sig  Dispense  Refill  . ibuprofen (ADVIL,MOTRIN) 800 MG tablet   Oral   Take 1 tablet (800 mg total) by mouth 3 (three) times daily.   21 tablet   0   . terbinafine (LAMISIL) 1 % cream   Topical   Apply topically 2 (two) times daily.   30 g   2    BP 131/78  Pulse 102  Temp(Src) 98.3 F (36.8 C) (Oral)  Resp 16  SpO2 98% Physical Exam  Nursing note and vitals reviewed. Constitutional: He appears well-developed and well-nourished. No distress.  Awake, alert, nontoxic appearance  HENT:  Head: Normocephalic and atraumatic.  Mouth/Throat: Oropharynx is clear and moist. No oropharyngeal exudate.  Eyes: Conjunctivae are normal. No scleral icterus.  Neck: Normal range of motion. Neck supple.  Cardiovascular: Normal rate, regular rhythm, normal heart sounds and intact distal pulses.   No murmur heard. Pulmonary/Chest: Effort normal and breath sounds normal. No respiratory distress. He has no wheezes.  Abdominal: Soft. Bowel sounds are normal. He exhibits no mass. There is no tenderness. There is no rebound and no guarding.  Musculoskeletal: Normal range of motion. He exhibits no edema.  Pain to palpation of the third toe of the right foot without deformity or  ecchymosis Full range of motion of all toes of the right foot  Lymphadenopathy:    He has no cervical adenopathy.  Neurological: He is alert. He exhibits normal muscle tone. Coordination normal.  Speech is clear and goal oriented Moves extremities without ataxia Sensation intact Strength 5 out of 5 including dorsiflexion  and plantar flexion  Skin: Skin is warm and dry. No rash noted. He is not diaphoretic. No erythema.  Itchy scaly rash with skin cracking on the soles of bilateral feet in between the toes  Psychiatric: He has a normal mood and affect.    ED Course  Procedures (including critical care time) Medications - No data to display  DIAGNOSTIC STUDIES: Oxygen Saturation is 98% on RA, normal by my interpretation.    COORDINATION OF CARE: 11:54 PM- Discussed treatment plan with pt which includes antifungal cream and pt agrees to plan.    Labs Review Labs Reviewed - No data to display Imaging Review Dg Toe 3rd Right  03/22/2013   *RADIOLOGY REPORT*  Clinical Data: .  Third toe 1 month ago with persistent pain.  RIGHT THIRD TOE  Comparison: None available at time of study interpretation.  Findings: No acute fracture deformity or dislocation.  Ankylosis of the third digit and second proximal interphalangeal joint space which may reflect remote injury.  No destructive bony lesions. Soft tissue planes are not suspicious.  IMPRESSION: No acute fracture deformity nor dislocation.   Original Report Authenticated By: Awilda Metro    MDM   1. Pain in toe of right foot   2. Tinea pedis     Edwin Palmer presents with persistent right 3rd toe pain.  .Patient X-Ray negative for obvious fracture or dislocation. I personally reviewed the imaging tests through PACS system.  I reviewed available ER/hospitalization records through the EMR.  Patient given toes buddy taped while in ED, conservative therapy recommended and discussed. Patient also with a 2 exam consistent with tinea pedis. He reports he wears work boots for greater than 12 hours per day and does not change his socks. He also reports that his feet generally stayed wet with sweat. Conservative therapies discussed the patient will be discharged home with a prescription for terbinafine. Patient will be dc home & is agreeable with above plan.  It has  been determined that no acute conditions requiring further emergency intervention are present at this time. The patient/guardian have been advised of the diagnosis and plan. We have discussed signs and symptoms that warrant return to the ED, such as changes or worsening in symptoms.   Vital signs are stable at discharge.   BP 131/78  Pulse 102  Temp(Src) 98.3 F (36.8 C) (Oral)  Resp 16  SpO2 98%  Patient/guardian has voiced understanding and agreed to follow-up with the PCP or specialist.    I personally performed the services described in this documentation, which was scribed in my presence. The recorded information has been reviewed and is accurate.           Dahlia Client Harmonie Verrastro, PA-C 03/23/13 0011

## 2013-03-22 NOTE — ED Notes (Signed)
Pt. reports right 3rd toe injury 1 1/2 months ago at home , presents with pain /bruise at right 3rd toe.

## 2013-03-23 NOTE — ED Provider Notes (Signed)
Medical screening examination/treatment/procedure(s) were performed by non-physician practitioner and as supervising physician I was immediately available for consultation/collaboration.    Candyce Churn, MD 03/23/13 1124

## 2013-03-23 NOTE — Progress Notes (Signed)
Orthopedic Tech Progress Note Patient Details:  Edwin Palmer [birth year] [id number]  Ortho Devices Type of Ortho Device: Buddy tape   Haskell Flirt 03/23/2013, 12:22 AM

## 2013-10-15 ENCOUNTER — Emergency Department (HOSPITAL_COMMUNITY): Payer: No Typology Code available for payment source

## 2013-10-15 ENCOUNTER — Emergency Department (HOSPITAL_COMMUNITY)
Admission: EM | Admit: 2013-10-15 | Discharge: 2013-10-15 | Disposition: A | Discharge status: Home / Self Care | Payer: No Typology Code available for payment source | Attending: Emergency Medicine | Admitting: Emergency Medicine

## 2013-10-15 ENCOUNTER — Encounter (HOSPITAL_COMMUNITY): Payer: Self-pay | Admitting: Emergency Medicine

## 2013-10-15 DIAGNOSIS — S99929A Unspecified injury of unspecified foot, initial encounter: Principal | ICD-10-CM

## 2013-10-15 DIAGNOSIS — S199XXA Unspecified injury of neck, initial encounter: Secondary | ICD-10-CM

## 2013-10-15 DIAGNOSIS — S8990XA Unspecified injury of unspecified lower leg, initial encounter: Secondary | ICD-10-CM | POA: Insufficient documentation

## 2013-10-15 DIAGNOSIS — R51 Headache: Secondary | ICD-10-CM | POA: Insufficient documentation

## 2013-10-15 DIAGNOSIS — K5900 Constipation, unspecified: Secondary | ICD-10-CM | POA: Insufficient documentation

## 2013-10-15 DIAGNOSIS — Y939 Activity, unspecified: Secondary | ICD-10-CM | POA: Insufficient documentation

## 2013-10-15 DIAGNOSIS — F17200 Nicotine dependence, unspecified, uncomplicated: Secondary | ICD-10-CM | POA: Insufficient documentation

## 2013-10-15 DIAGNOSIS — S99919A Unspecified injury of unspecified ankle, initial encounter: Principal | ICD-10-CM

## 2013-10-15 DIAGNOSIS — S0993XA Unspecified injury of face, initial encounter: Secondary | ICD-10-CM | POA: Insufficient documentation

## 2013-10-15 MED ORDER — NAPROXEN 500 MG PO TABS: 500.0000 mg | ORAL_TABLET | Freq: Two times a day (BID) | ORAL | Status: DC

## 2013-10-15 MED ORDER — ACETAMINOPHEN 500 MG PO TABS
1000.0000 mg | ORAL_TABLET | Freq: Once | ORAL | Status: AC
  Administered 2013-10-15: 1000 mg via ORAL
  Filled 2013-10-15: qty 2

## 2013-10-15 NOTE — ED Notes (Signed)
Pt c/o pain to right knee, right ankle, right side, right hand and elbow. There is no injury noted. No deformity.
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
## 2013-10-15 NOTE — ED Notes (Signed)
Patient transported to X-ray 
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
## 2013-10-17 NOTE — ED Provider Notes (Signed)
Medical screening examination/treatment/procedure(s) were performed by non-physician practitioner and as supervising physician I was immediately available for consultation/collaboration.   EKG Interpretation None        Edwin ChurnJohn David Pernie Grosso III, MD 10/17/13 1045

## 2013-10-19 ENCOUNTER — Encounter (HOSPITAL_COMMUNITY): Payer: Self-pay | Admitting: Emergency Medicine

## 2013-10-19 ENCOUNTER — Emergency Department (INDEPENDENT_AMBULATORY_CARE_PROVIDER_SITE_OTHER)
Admission: EM | Admit: 2013-10-19 | Discharge: 2013-10-19 | Disposition: A | Discharge status: Home / Self Care | Payer: Self-pay | Source: Home / Self Care

## 2013-10-19 DIAGNOSIS — S335XXA Sprain of ligaments of lumbar spine, initial encounter: Secondary | ICD-10-CM

## 2013-10-19 DIAGNOSIS — M545 Low back pain, unspecified: Secondary | ICD-10-CM

## 2013-10-19 DIAGNOSIS — S39012A Strain of muscle, fascia and tendon of lower back, initial encounter: Secondary | ICD-10-CM

## 2013-10-19 DIAGNOSIS — M546 Pain in thoracic spine: Secondary | ICD-10-CM

## 2013-10-19 MED ORDER — TRAMADOL-ACETAMINOPHEN 37.5-325 MG PO TABS: 2.0000 | ORAL_TABLET | Freq: Four times a day (QID) | ORAL | Status: DC | PRN

## 2013-10-19 NOTE — Discharge Instructions (Signed)
Back Pain, Adult °Back pain is very common. The pain often gets better over time. The cause of back pain is usually not dangerous. Most people can learn to manage their back pain on their own.  °HOME CARE  °· Stay active. Start with short walks on flat ground if you can. Try to walk farther each day. °· Do not sit, drive, or stand in one place for more than 30 minutes. Do not stay in bed. °· Do not avoid exercise or work. Activity can help your back heal faster. °· Be careful when you bend or lift an object. Bend at your knees, keep the object close to you, and do not twist. °· Sleep on a firm mattress. Lie on your side, and bend your knees. If you lie on your back, put a pillow under your knees. °· Only take medicines as told by your doctor. °· Put ice on the injured area. °· Put ice in a plastic bag. °· Place a towel between your skin and the bag. °· Leave the ice on for 15-20 minutes, 03-04 times a day for the first 2 to 3 days. After that, you can switch between ice and heat packs. °· Ask your doctor about back exercises or massage. °· Avoid feeling anxious or stressed. Find good ways to deal with stress, such as exercise. °GET HELP RIGHT AWAY IF:  °· Your pain does not go away with rest or medicine. °· Your pain does not go away in 1 week. °· You have new problems. °· You do not feel well. °· The pain spreads into your legs. °· You cannot control when you poop (bowel movement) or pee (urinate). °· Your arms or legs feel weak or lose feeling (numbness). °· You feel sick to your stomach (nauseous) or throw up (vomit). °· You have belly (abdominal) pain. °· You feel like you may pass out (faint). °MAKE SURE YOU:  °· Understand these instructions. °· Will watch your condition. °· Will get help right away if you are not doing well or get worse. °Document Released: 11/24/2007 Document Revised: 08/30/2011 Document Reviewed: 10/26/2010 °ExitCare® Patient Information ©2014 [employer]. ° °Lumbosacral  Strain °Lumbosacral strain is a strain of any of the parts that make up your lumbosacral vertebrae. Your lumbosacral vertebrae are the bones that make up the lower third of your backbone. Your lumbosacral vertebrae are held together by muscles and tough, fibrous tissue (ligaments).  °CAUSES  °A sudden blow to your back can cause lumbosacral strain. Also, anything that causes an excessive stretch of the muscles in the low back can cause this strain. This is typically seen when people exert themselves strenuously, fall, lift heavy objects, bend, or crouch repeatedly. °RISK FACTORS °· Physically demanding work. °· Participation in pushing or pulling sports or sports that require sudden twist of the back (tennis, golf, baseball). °· Weight lifting. °· Excessive lower back curvature. °· Forward-tilted pelvis. °· Weak back or abdominal muscles or both. °· Tight hamstrings. °SIGNS AND SYMPTOMS  °Lumbosacral strain may cause pain in the area of your injury or pain that moves (radiates) down your leg.  °DIAGNOSIS °Your health care provider can often diagnose lumbosacral strain through a physical exam. In some cases, you may need tests such as X-ray exams.  °TREATMENT  °Treatment for your lower back injury depends on many factors that your clinician will have to evaluate. However, most treatment will include the use of anti-inflammatory medicines. °HOME CARE INSTRUCTIONS  °· Avoid hard physical activities (tennis,   racquetball, waterskiing) if you are not in proper physical condition for it. This may aggravate or create problems.  If you have a back problem, avoid sports requiring sudden body movements. Swimming and walking are generally safer activities.  Maintain good posture.  Maintain a healthy weight.  For acute conditions, you may put ice on the injured area.  Put ice in a plastic bag.  Place a towel between your skin and the bag.  Leave the ice on for 20 minutes, 2 3 times a day.  When the low back  starts healing, stretching and strengthening exercises may be recommended. SEEK MEDICAL CARE IF:  Your back pain is getting worse.  You experience severe back pain not relieved with medicines. SEEK IMMEDIATE MEDICAL CARE IF:   You have numbness, tingling, weakness, or problems with the use of your arms or legs.  There is a change in bowel or bladder control.  You have increasing pain in any area of the body, including your belly (abdomen).  You notice shortness of breath, dizziness, or feel faint.  You feel sick to your stomach (nauseous), are throwing up (vomiting), or become sweaty.  You notice discoloration of your toes or legs, or your feet get very cold. MAKE SURE YOU:   Understand these instructions.  Will watch your condition.  Will get help right away if you are not doing well or get worse. Document Released: 03/17/2005 Document Revised: 03/28/2013 Document Reviewed: 01/24/2013 [facility] Patient Information 2014 [place], [place].  Motor Vehicle Collision After a car crash (motor vehicle collision), it is normal to have bruises and sore muscles. The first 24 hours usually feel the worst. After that, you will likely start to feel better each day. HOME CARE  Put ice on the injured area.  Put ice in a plastic bag.  Place a towel between your skin and the bag.  Leave the ice on for 15-20 minutes, 03-04 times a day.  Drink enough fluids to keep your pee (urine) clear or pale yellow.  Do not drink alcohol.  Take a warm shower or bath 1 or 2 times a day. This helps your sore muscles.  Return to activities as told by your doctor. Be careful when lifting. Lifting can make neck or back pain worse.  Only take medicine as told by your doctor. Do not use aspirin. GET HELP RIGHT AWAY IF:   Your arms or legs tingle, feel weak, or lose feeling (numbness).  You have headaches that do not get better with medicine.  You have neck pain, especially in the middle of the back of  your neck.  You cannot control when you pee (urinate) or poop (bowel movement).  Pain is getting worse in any part of your body.  You are short of breath, dizzy, or pass out (faint).  You have chest pain.  You feel sick to your stomach (nauseous), throw up (vomit), or sweat.  You have belly (abdominal) pain that gets worse.  There is blood in your pee, poop, or throw up.  You have pain in your shoulder (shoulder strap areas).  Your problems are getting worse. MAKE SURE YOU:   Understand these instructions.  Will watch your condition.  Will get help right away if you are not doing well or get worse. Document Released: 11/24/2007 Document Revised: 08/30/2011 Document Reviewed: 11/04/2010 [facility] Patient Information 2014 [place], [place].  Muscle Strain A muscle strain (pulled muscle) happens when a muscle is stretched beyond normal length. It happens when a sudden,  violent force stretches your muscle too far. Usually, a few of the fibers in your muscle are torn. Muscle strain is common in athletes. Recovery usually takes 1 2 weeks. Complete healing takes 5 6 weeks.  HOME CARE   Follow the PRICE method of treatment to help your injury get better. Do this the first 2 3 days after the injury:  Protect. Protect the muscle to keep it from getting injured again.  Rest. Limit your activity and rest the injured body part.  Ice. Put ice in a plastic bag. Place a towel between your skin and the bag. Then, apply the ice and leave it on from 15 20 minutes each hour. After the third day, switch to moist heat packs.  Compression. Use a splint or elastic bandage on the injured area for comfort. Do not put it on too tightly.  Elevate. Keep the injured body part above the level of your heart.  Only take medicine as told by your doctor.  Warm up before doing exercise to prevent future muscle strains. GET HELP IF:   You have more pain or puffiness (swelling) in the injured area.  You  feel numbness, tingling, or notice a loss of strength in the injured area. MAKE SURE YOU:   Understand these instructions.  Will watch your condition.  Will get help right away if you are not doing well or get worse. Document Released: 03/16/2008 Document Revised: 03/28/2013 Document Reviewed: 01/04/2013 [facility]ExitCare Patient Information 2014 [place]ExitCare, [place]LL[place].
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
## 2013-10-21 NOTE — ED Provider Notes (Signed)
Medical screening examination/treatment/procedure(s) were performed by a resident physician or non-physician practitioner and as the supervising physician I was immediately available for consultation/collaboration.  Tema Alire, MD    Jenicka Coxe S Natashia Roseman, MD 10/21/13 0847 

## 2017-12-03 ENCOUNTER — Other Ambulatory Visit: Payer: Self-pay

## 2017-12-03 ENCOUNTER — Encounter (HOSPITAL_COMMUNITY): Payer: Self-pay | Admitting: Emergency Medicine

## 2017-12-03 ENCOUNTER — Emergency Department (HOSPITAL_COMMUNITY)
Admission: EM | Admit: 2017-12-03 | Discharge: 2017-12-04 | Disposition: A | Discharge status: Home / Self Care | Payer: Self-pay | Attending: Emergency Medicine | Admitting: Emergency Medicine

## 2017-12-03 DIAGNOSIS — K047 Periapical abscess without sinus: Secondary | ICD-10-CM | POA: Insufficient documentation

## 2017-12-03 DIAGNOSIS — Z5321 Procedure and treatment not carried out due to patient leaving prior to being seen by health care provider: Secondary | ICD-10-CM | POA: Insufficient documentation

## 2017-12-03 NOTE — ED Triage Notes (Signed)
Pt states he has recurrent dental abscesses to left jaw. Occurs every 6 months or so. Sometimes can drain this himself but pt states he knows that until he can get his teeth pulled this will recur.

## 2017-12-04 NOTE — ED Notes (Signed)
No response when called for vitals.  

## 2017-12-04 NOTE — ED Notes (Signed)
Patient called 2x to reassess vital signs with no answer.

## 2017-12-05 ENCOUNTER — Other Ambulatory Visit: Payer: Self-pay

## 2017-12-05 ENCOUNTER — Encounter (HOSPITAL_COMMUNITY): Payer: Self-pay | Admitting: Emergency Medicine

## 2017-12-05 ENCOUNTER — Emergency Department (HOSPITAL_COMMUNITY)
Admission: EM | Admit: 2017-12-05 | Discharge: 2017-12-05 | Disposition: A | Discharge status: Home / Self Care | Payer: Self-pay | Attending: Emergency Medicine | Admitting: Emergency Medicine

## 2017-12-05 DIAGNOSIS — F17200 Nicotine dependence, unspecified, uncomplicated: Secondary | ICD-10-CM | POA: Insufficient documentation

## 2017-12-05 DIAGNOSIS — K0889 Other specified disorders of teeth and supporting structures: Secondary | ICD-10-CM | POA: Insufficient documentation

## 2017-12-05 MED ORDER — PENICILLIN V POTASSIUM 500 MG PO TABS: 500.0000 mg | ORAL_TABLET | Freq: Four times a day (QID) | ORAL | 0 refills | Status: AC

## 2017-12-05 MED ORDER — ACETAMINOPHEN 325 MG PO TABS
650.0000 mg | ORAL_TABLET | Freq: Once | ORAL | Status: AC
Start: 2017-12-05 — End: 2017-12-05
  Administered 2017-12-05: 650 mg via ORAL
  Filled 2017-12-05: qty 2

## 2017-12-05 MED ORDER — OXYCODONE-ACETAMINOPHEN 5-325 MG PO TABS: 1.0000 | ORAL_TABLET | ORAL | 0 refills | Status: DC | PRN

## 2017-12-05 MED ORDER — PENICILLIN V POTASSIUM 250 MG PO TABS
500.0000 mg | ORAL_TABLET | Freq: Once | ORAL | Status: AC
  Administered 2017-12-05: 500 mg via ORAL
  Filled 2017-12-05: qty 2

## 2017-12-05 NOTE — ED Provider Notes (Signed)
[facility] Note   CSN: [id number] Arrival date & time: 12/05/17  2117     History   Chief Complaint Chief Complaint  Patient presents with  . Dental Pain    HPI Edwin Palmer is a 37 y.o. male who presents to ED for evaluation of dental pain for the past 5 days.  He states that he has had this issue several times usually every 6 months.  He has not seen a dentist in several years.  He has tried ibuprofen once with no improvement in his symptoms.  Denies any trauma to the area.  Denies any fever, drainage or bleeding from site, trouble breathing or trouble swallowing, changes in voice.  HPI  History reviewed. No pertinent past medical history.  There are no active problems to display for this patient.   History reviewed. No pertinent surgical history.      Home Medications    Prior to Admission medications   Medication Sig Start Date End Date Taking? Authorizing Provider  naproxen (NAPROSYN) 500 MG tablet Take 1 tablet (500 mg total) by mouth 2 (two) times daily with a meal. 10/15/13   Arthor CaptainHarris, Abigail, PA-C  oxyCODONE-acetaminophen (PERCOCET/ROXICET) 5-325 MG tablet Take 1 tablet by mouth every 4 (four) hours as needed for severe pain. 12/05/17   Zadin Lange, PA-C  penicillin v potassium (VEETID) 500 MG tablet Take 1 tablet (500 mg total) by mouth 4 (four) times daily for 7 days. 12/05/17 12/12/17  Brayan Votaw, Hillary BowHina, PA-C  traMADol-acetaminophen (ULTRACET) 37.5-325 MG per tablet Take 2 tablets by mouth every 6 (six) hours as needed. 10/19/13   Hayden RasmussenMabe, David, NP    Family History No family history on file.  Social History Social History   Tobacco Use  . Smoking status: Current Every Day Smoker    Packs/day: 1.00  . Smokeless tobacco: Never Used  Substance Use Topics  . Alcohol use: Yes  . Drug use: No     Allergies   Patient has no known allergies.   Review of Systems Review of Systems  Constitutional: Negative for  chills and fever.  HENT: Positive for dental problem. Negative for mouth sores and rhinorrhea.   Gastrointestinal: Negative for nausea and vomiting.  Musculoskeletal: Negative for neck pain and neck stiffness.     Physical Exam Updated Vital Signs BP 138/89 (BP Location: Right Arm)   Pulse 90   Temp 100 F (37.8 C) (Oral)   Resp 18   SpO2 100%   Physical Exam  Constitutional: He appears well-developed and well-nourished. No distress.  HENT:  Head: Normocephalic and atraumatic.  Mouth/Throat: Uvula is midline. He does not have dentures. Abnormal dentition. Dental caries present. No dental abscesses or lacerations.    Tenderness to palpation at the gumline of the indicated tooth.  There is mild induration of the gum noted.  No gross dental abscess or fluctuance palpated. No facial, neck or cheek swelling noted. No pooling of secretions or trismus.  Normal voice noted with no difficulty swallowing or breathing.  No submandibular or sublingual erythema, edema or crepitus noted.  Eyes: Conjunctivae and EOM are normal. No scleral icterus.  Neck: Normal range of motion.  Pulmonary/Chest: Effort normal. No respiratory distress.  Neurological: He is alert.  Skin: No rash noted. He is not diaphoretic.  Psychiatric: He has a normal mood and affect.  Nursing note and vitals reviewed.    ED Treatments / Results  Labs (all labs ordered are listed, but only abnormal  results are displayed) Labs Reviewed - No data to display  EKG None  Radiology No results found.  Procedures Procedures (including critical care time)  Medications Ordered in ED Medications  penicillin v potassium (VEETID) tablet 500 mg (has no administration in time range)  acetaminophen (TYLENOL) tablet 650 mg (has no administration in time range)     Initial Impression / Assessment and Plan / ED Course  I have reviewed the triage vital signs and the nursing notes.  Pertinent labs & imaging results that were  available during my care of the patient were reviewed by me and considered in my medical decision making (see chart for details).     Patient with dentalgia. On exam, there is no evidence of a drainable abscess. No trismus, glossal elevation, unilateral tonsillar swelling. No evidence of retropharyngeal or peritonsillar abscess or Ludwig angina. Will treat with  short course of pain medication, antibiotics. Pt instructed to follow-up with dentist as soon as possible. Resource guide provided with AVS.  Portions of this note were generated with Scientist, clinical (histocompatibility and immunogenetics). Dictation errors may occur despite best attempts at proofreading.   Final Clinical Impressions(s) / ED Diagnoses   Final diagnoses:  Pain, dental    ED Discharge Orders        Ordered    oxyCODONE-acetaminophen (PERCOCET/ROXICET) 5-325 MG tablet  Every 4 hours PRN     12/05/17 2150    penicillin v potassium (VEETID) 500 MG tablet  4 times daily     12/05/17 2150       Dietrich Pates, PA-C 12/05/17 2153    Wynetta Fines, MD 12/05/17 2357

## 2017-12-05 NOTE — ED Triage Notes (Signed)
Pt reports recurrent L lower dental abscess. States it happens every few months, needs to have the tooth pulled but doesn't have a dentist to follow up with.

## 2018-03-04 ENCOUNTER — Other Ambulatory Visit: Payer: Self-pay

## 2018-03-04 ENCOUNTER — Encounter (HOSPITAL_COMMUNITY): Payer: Self-pay | Admitting: Emergency Medicine

## 2018-03-04 ENCOUNTER — Emergency Department (HOSPITAL_COMMUNITY)
Admission: EM | Admit: 2018-03-04 | Discharge: 2018-03-04 | Disposition: A | Discharge status: Home / Self Care | Payer: Self-pay | Attending: Emergency Medicine | Admitting: Emergency Medicine

## 2018-03-04 DIAGNOSIS — K0889 Other specified disorders of teeth and supporting structures: Secondary | ICD-10-CM | POA: Insufficient documentation

## 2018-03-04 DIAGNOSIS — F17200 Nicotine dependence, unspecified, uncomplicated: Secondary | ICD-10-CM | POA: Insufficient documentation

## 2018-03-04 MED ORDER — IBUPROFEN 600 MG PO TABS: 600.0000 mg | ORAL_TABLET | Freq: Four times a day (QID) | ORAL | 0 refills | Status: DC | PRN

## 2018-03-04 MED ORDER — HYDROXYZINE HCL 25 MG PO TABS: 25.0000 mg | ORAL_TABLET | Freq: Four times a day (QID) | ORAL | 0 refills | Status: DC

## 2018-03-04 MED ORDER — PENICILLIN V POTASSIUM 500 MG PO TABS: 500.0000 mg | ORAL_TABLET | Freq: Four times a day (QID) | ORAL | 0 refills | Status: AC

## 2018-03-04 NOTE — ED Triage Notes (Addendum)
3 days ago, recurrent dental pain flared up.  Abscess in roots.  Has not received dental care.  Has not taken anything to relieve it.

## 2018-03-04 NOTE — Discharge Instructions (Addendum)
Take penicillin as directed Take hydroxyzine for sleep as needed Take ibuprofen three times daily for pain/inflammation Follow up with a dentist

## 2018-03-04 NOTE — ED Provider Notes (Signed)
[facility] EMERGENCY DEPARTMENT Provider Note   CSN: [id number] Arrival date & time: 03/04/18  1747     History   Chief Complaint Chief Complaint  Patient presents with  . Dental Pain    HPI Edwin Palmer is a 37 y.o. male who presents with dental pain. PMH significant for recurrent dental pain and abscess. He states that for the past 2 days he has had left lower dental pain and swelling over his jaw. His symptoms are the same as last time he was seen for this. He knows he needs his teeth pulled but hasn't arranged it yet. No fever or difficulty swallowing. He hasn't taken anything for pain. He is requesting antibiotics and "something to sleep".   HPI  History reviewed. No pertinent past medical history.  There are no active problems to display for this patient.   History reviewed. No pertinent surgical history.      Home Medications    Prior to Admission medications   Medication Sig Start Date End Date Taking? Authorizing Provider  naproxen (NAPROSYN) 500 MG tablet Take 1 tablet (500 mg total) by mouth 2 (two) times daily with a meal. 10/15/13   Arthor Captain, PA-C  oxyCODONE-acetaminophen (PERCOCET/ROXICET) 5-325 MG tablet Take 1 tablet by mouth every 4 (four) hours as needed for severe pain. 12/05/17   Khatri, Hina, PA-C  traMADol-acetaminophen (ULTRACET) 37.5-325 MG per tablet Take 2 tablets by mouth every 6 (six) hours as needed. 10/19/13   Hayden Rasmussen, NP    Family History History reviewed. No pertinent family history.  Social History Social History   Tobacco Use  . Smoking status: Current Every Day Smoker    Packs/day: 1.00  . Smokeless tobacco: Never Used  Substance Use Topics  . Alcohol use: Yes  . Drug use: No     Allergies   Patient has no known allergies.   Review of Systems Review of Systems  Constitutional: Negative for fever.  HENT: Positive for dental problem.      Physical Exam Updated Vital Signs BP 125/75 (BP  Location: Right Arm)   Pulse 87   Temp 98.2 F (36.8 C) (Oral)   Resp 12   Ht 5\' 7"  (1.702 m)   Wt 104.3 kg   SpO2 99%   BMI 36.02 kg/m   Physical Exam  Constitutional: He is oriented to person, place, and time. He appears well-developed and well-nourished. No distress.  HENT:  Head: Normocephalic and atraumatic.  Left Ear: Tympanic membrane normal.  Mouth/Throat: Uvula is midline. No trismus in the jaw. Abnormal dentition. Dental caries present. No dental abscesses.  Eyes: Pupils are equal, round, and reactive to light. Conjunctivae are normal. Right eye exhibits no discharge. Left eye exhibits no discharge. No scleral icterus.  Neck: Normal range of motion.  Cardiovascular: Normal rate.  Pulmonary/Chest: Effort normal. No respiratory distress.  Abdominal: He exhibits no distension.  Neurological: He is alert and oriented to person, place, and time.  Skin: Skin is warm and dry.  Psychiatric: He has a normal mood and affect. His behavior is normal.  Nursing note and vitals reviewed.    ED Treatments / Results  Labs (all labs ordered are listed, but only abnormal results are displayed) Labs Reviewed - No data to display  EKG None  Radiology No results found.  Procedures Procedures (including critical care time)  Medications Ordered in ED Medications - No data to display   Initial Impression / Assessment and Plan / ED Course  I have reviewed the triage vital signs and the nursing notes.  Pertinent labs & imaging results that were available during my care of the patient were reviewed by me and considered in my medical decision making (see chart for details).  Dental pain associated with dental caries and possible dental infection. Patient is afebrile, non toxic appearing, and swallowing secretions well. No concerning findings on exam. No obvious abscess and doubt deep space head or neck infection.  I gave patient referral to dentist and stressed the importance of  dental follow up for ultimate management of dental pain. Discussed findings, treatment, and follow up  with patient.  Pt given return precautions.  Pt verbalizes understanding and agrees with plan. Patient expresses understanding and agrees with plan. He was given rx for Ibuprofen, atarax for sleep, and PCN.    Final Clinical Impressions(s) / ED Diagnoses   Final diagnoses:  Pain, dental    ED Discharge Orders    None       Bethel BornGekas, Tashika Goodin Marie, PA-C 03/04/18 2003    [address]Floyd, Dan, DO 03/04/18 2345

## 2018-03-04 NOTE — ED Triage Notes (Signed)
Pt says he needs his teeth pulled.  The root is infected.

## 2018-11-16 ENCOUNTER — Encounter (HOSPITAL_COMMUNITY): Payer: Self-pay | Admitting: Emergency Medicine

## 2018-11-16 ENCOUNTER — Other Ambulatory Visit: Payer: Self-pay

## 2018-11-16 ENCOUNTER — Emergency Department (HOSPITAL_COMMUNITY)
Admission: EM | Admit: 2018-11-16 | Discharge: 2018-11-17 | Disposition: A | Discharge status: Home / Self Care | Payer: Self-pay | Attending: Emergency Medicine | Admitting: Emergency Medicine

## 2018-11-16 DIAGNOSIS — K0889 Other specified disorders of teeth and supporting structures: Secondary | ICD-10-CM

## 2018-11-16 DIAGNOSIS — K047 Periapical abscess without sinus: Secondary | ICD-10-CM | POA: Insufficient documentation

## 2018-11-16 DIAGNOSIS — F17210 Nicotine dependence, cigarettes, uncomplicated: Secondary | ICD-10-CM | POA: Insufficient documentation

## 2018-11-16 NOTE — ED Triage Notes (Signed)
Patient reports dental pain and swelling to Left lower mouth x2-3 days. Patient reports that symptoms are recurrent, had a dental appt set up prior to covid 19 outbreak but his appt was cancelled due to the virus outbreak.

## 2018-11-17 MED ORDER — IBUPROFEN 800 MG PO TABS
800.0000 mg | ORAL_TABLET | Freq: Once | ORAL | Status: AC
  Administered 2018-11-17: 800 mg via ORAL
  Filled 2018-11-17: qty 1

## 2018-11-17 MED ORDER — CLINDAMYCIN HCL 150 MG PO CAPS: 450.0000 mg | ORAL_CAPSULE | Freq: Three times a day (TID) | ORAL | 0 refills | Status: DC

## 2018-11-17 MED ORDER — CLINDAMYCIN HCL 150 MG PO CAPS
600.0000 mg | ORAL_CAPSULE | Freq: Once | ORAL | Status: AC
  Administered 2018-11-17: 600 mg via ORAL
  Filled 2018-11-17: qty 4

## 2018-11-17 NOTE — ED Notes (Signed)
Patient verbalized understanding of dc instructions, vss, ambulatory with nad.   

## 2018-11-17 NOTE — Discharge Instructions (Addendum)
1. Medications: Clindamycin, alternate ibuprofen 800mg  and tylenol 1000mg  every 3-4 hours for pain control.  Do not exceed 3 doses of ibuprofen or 4 doses of tylenol in any 24 hour perid, usual home medications 2. Treatment: rest, drink plenty of fluids, take medications as prescribed 3. Follow Up: Please followup with dentistry within 3 days for discussion of your diagnoses and further evaluation after today's visit; if you do not have a primary care doctor use the resource guide provided to find one; Return to the ER for high fevers, difficulty breathing, difficulty swallowing or other concerning symptoms

## 2018-11-17 NOTE — ED Provider Notes (Signed)
[facility] EMERGENCY DEPARTMENT Provider Note   CSN: [id number] Arrival date & time: 11/16/18  2348    History   Chief Complaint Chief Complaint  Patient presents with  . Dental Pain    HPI Edwin Palmer is a 38 y.o. male with a hx of no major medical problems presents to the Emergency Department complaining of gradual, persistent, progressively worsening left lower dental pain onset 2-3 days ago.  Patient has associated facial swelling worse in the last 24 hours.  Patient reports he has numerous dental caries, especially in that area and is supposed to have the teeth extracted but he has been unable to find a dentist due to dentist being closed during quarantine.  Patient reports last year he had a dental infection and took penicillin but did not complete the antibiotic.  He reports he has taken several doses in the last couple of days but they have not helped.  He does not have any more of this medication.  Patient denies fever, chills, headache, neck pain, neck stiffness, chest pain, shortness of breath abdominal pain, nausea, vomiting, diarrhea.  Patient reports eating and drinking hurts so badly that he is not doing a lot of either.  He reports no difficulty swallowing or speaking.  He has no history of immunocompromise, diabetes, HIV or steroid usage.     The history is provided by the patient and medical records. No language interpreter was used.    History reviewed. No pertinent past medical history.  There are no active problems to display for this patient.   History reviewed. No pertinent surgical history.      Home Medications    Prior to Admission medications   Medication Sig Start Date End Date Taking? Authorizing Provider  clindamycin (CLEOCIN) 150 MG capsule Take 3 capsules (450 mg total) by mouth 3 (three) times daily. 11/17/18   Breydon Senters, Dahlia Client, PA-C  hydrOXYzine (ATARAX/VISTARIL) 25 MG tablet Take 1 tablet (25 mg total) by mouth every 6  (six) hours. 03/04/18   Bethel Born, PA-C  ibuprofen (ADVIL,MOTRIN) 600 MG tablet Take 1 tablet (600 mg total) by mouth every 6 (six) hours as needed. 03/04/18   Bethel Born, PA-C  naproxen (NAPROSYN) 500 MG tablet Take 1 tablet (500 mg total) by mouth 2 (two) times daily with a meal. 10/15/13   Arthor Captain, PA-C  oxyCODONE-acetaminophen (PERCOCET/ROXICET) 5-325 MG tablet Take 1 tablet by mouth every 4 (four) hours as needed for severe pain. 12/05/17   Khatri, Hina, PA-C  traMADol-acetaminophen (ULTRACET) 37.5-325 MG per tablet Take 2 tablets by mouth every 6 (six) hours as needed. 10/19/13   Hayden Rasmussen, NP    Family History No family history on file.  Social History Social History   Tobacco Use  . Smoking status: Current Every Day Smoker    Packs/day: 1.00  . Smokeless tobacco: Never Used  Substance Use Topics  . Alcohol use: Yes  . Drug use: No     Allergies   Patient has no known allergies.   Review of Systems Review of Systems  Constitutional: Negative for appetite change, chills and fever.  HENT: Positive for dental problem and facial swelling. Negative for drooling, ear pain, nosebleeds, postnasal drip, rhinorrhea and trouble swallowing.   Eyes: Negative for pain and redness.  Respiratory: Negative for cough and wheezing.   Cardiovascular: Negative for chest pain.  Gastrointestinal: Negative for abdominal pain, nausea and vomiting.  Musculoskeletal: Negative for neck pain and neck stiffness.  Skin:  Negative for color change and rash.  Neurological: Negative for weakness, light-headedness and headaches.  All other systems reviewed and are negative.    Physical Exam Updated Vital Signs BP 135/81 (BP Location: Left Arm)   Pulse 99   Temp 99.6 F (37.6 C) (Oral)   Resp 18   Physical Exam Vitals signs and nursing note reviewed.  Constitutional:      Appearance: He is well-developed.  HENT:     Head: Normocephalic.     Jaw: Tenderness and swelling  present. No trismus.      Comments: No TTP to the soft tissue under the mandible, no induration or swelling    Right Ear: External ear normal.     Left Ear: External ear normal.     Nose: Nose normal.     Right Sinus: No maxillary sinus tenderness or frontal sinus tenderness.     Left Sinus: No maxillary sinus tenderness or frontal sinus tenderness.     Mouth/Throat:     Mouth: Mucous membranes are moist. No lacerations or oral lesions.     Dentition: Abnormal dentition. Dental tenderness and dental caries present.     Pharynx: Uvula midline. No oropharyngeal exudate, posterior oropharyngeal erythema or uvula swelling.     Tonsils: No tonsillar abscesses.      Comments: No sublingual induration or pain No protrusion of the tongue Eyes:     General:        Right eye: No discharge.        Left eye: No discharge.     Conjunctiva/sclera: Conjunctivae normal.     Pupils: Pupils are equal, round, and reactive to light.  Neck:     Musculoskeletal: Normal range of motion and neck supple.     Comments: No stridor Handling secretions without difficulty No nuchal rigidity No cervical lymphadenopathy Cardiovascular:     Rate and Rhythm: Normal rate and regular rhythm.     Heart sounds: Normal heart sounds.  Pulmonary:     Effort: Pulmonary effort is normal. No respiratory distress.  Abdominal:     General: Bowel sounds are normal. There is no distension.     Palpations: Abdomen is soft.     Tenderness: There is no abdominal tenderness.  Lymphadenopathy:     Head:     Right side of head: No submental, submandibular, tonsillar, preauricular, posterior auricular or occipital adenopathy.     Left side of head: Submandibular and tonsillar adenopathy present. No submental, preauricular, posterior auricular or occipital adenopathy.     Cervical: No cervical adenopathy.  Skin:    General: Skin is warm and dry.  Neurological:     Mental Status: He is alert.      ED Treatments / Results    Procedures Procedures (including critical care time)  Medications Ordered in ED Medications  clindamycin (CLEOCIN) capsule 600 mg (600 mg Oral Given 11/17/18 0126)  ibuprofen (ADVIL) tablet 800 mg (800 mg Oral Given 11/17/18 0126)     Initial Impression / Assessment and Plan / ED Course  I have reviewed the triage vital signs and the nursing notes.  Pertinent labs & imaging results that were available during my care of the patient were reviewed by me and considered in my medical decision making (see chart for details).        Patient with toothache and small, palpable periapical abscess.  Pt offered I&D but does not want any needles.  Pt able to tolerate secretions without difficulty.  No posterior throat/neck  pain.  Clinda given in the ED and pt will be d/c home with the same.  Exam unconcerning for Ludwig's angina or spread of infection; no voice changes or trismus. Urged patient to follow-up with dentist.  Discussed strict return precautions and possible complications of a dental abscess.  Pt states understanding and is in agreement with the plan.     Final Clinical Impressions(s) / ED Diagnoses   Final diagnoses:  Pain, dental  Dental abscess    ED Discharge Orders         Ordered    clindamycin (CLEOCIN) 150 MG capsule  3 times daily     11/17/18 0055           Nykiah Ma, Dahlia Client, PA-C 11/17/18 0321    Zadie Rhine, MD 11/18/18 [phone]

## 2018-12-14 ENCOUNTER — Emergency Department (HOSPITAL_COMMUNITY): Payer: Self-pay

## 2018-12-14 ENCOUNTER — Encounter (HOSPITAL_COMMUNITY): Payer: Self-pay | Admitting: Emergency Medicine

## 2018-12-14 ENCOUNTER — Inpatient Hospital Stay (HOSPITAL_COMMUNITY)
Admission: EM | Admit: 2018-12-14 | Discharge: 2018-12-17 | DRG: 138 | Disposition: A | Discharge status: Home / Self Care | Payer: Self-pay | Attending: Internal Medicine | Admitting: Internal Medicine

## 2018-12-14 ENCOUNTER — Other Ambulatory Visit: Payer: Self-pay

## 2018-12-14 DIAGNOSIS — Z791 Long term (current) use of non-steroidal anti-inflammatories (NSAID): Secondary | ICD-10-CM

## 2018-12-14 DIAGNOSIS — Y92410 Unspecified street and highway as the place of occurrence of the external cause: Secondary | ICD-10-CM

## 2018-12-14 DIAGNOSIS — K047 Periapical abscess without sinus: Secondary | ICD-10-CM

## 2018-12-14 DIAGNOSIS — Z833 Family history of diabetes mellitus: Secondary | ICD-10-CM

## 2018-12-14 DIAGNOSIS — K0889 Other specified disorders of teeth and supporting structures: Secondary | ICD-10-CM | POA: Diagnosis present

## 2018-12-14 DIAGNOSIS — Z79891 Long term (current) use of opiate analgesic: Secondary | ICD-10-CM

## 2018-12-14 DIAGNOSIS — M272 Inflammatory conditions of jaws: Principal | ICD-10-CM

## 2018-12-14 DIAGNOSIS — R220 Localized swelling, mass and lump, head: Secondary | ICD-10-CM

## 2018-12-14 DIAGNOSIS — Z1159 Encounter for screening for other viral diseases: Secondary | ICD-10-CM

## 2018-12-14 DIAGNOSIS — Z79899 Other long term (current) drug therapy: Secondary | ICD-10-CM

## 2018-12-14 DIAGNOSIS — Z6839 Body mass index (BMI) 39.0-39.9, adult: Secondary | ICD-10-CM

## 2018-12-14 DIAGNOSIS — M2740 Unspecified cyst of jaw: Secondary | ICD-10-CM | POA: Diagnosis present

## 2018-12-14 DIAGNOSIS — Z720 Tobacco use: Secondary | ICD-10-CM | POA: Diagnosis present

## 2018-12-14 DIAGNOSIS — F17210 Nicotine dependence, cigarettes, uncomplicated: Secondary | ICD-10-CM | POA: Diagnosis present

## 2018-12-14 DIAGNOSIS — Z8249 Family history of ischemic heart disease and other diseases of the circulatory system: Secondary | ICD-10-CM

## 2018-12-14 DIAGNOSIS — E669 Obesity, unspecified: Secondary | ICD-10-CM

## 2018-12-14 HISTORY — DX: Tobacco use: Z72.0

## 2018-12-14 HISTORY — DX: Obesity, unspecified: E66.9

## 2018-12-14 LAB — BASIC METABOLIC PANEL
Anion gap: 11 (ref 5–15)
BUN: 5 mg/dL — ABNORMAL LOW (ref 6–20)
CO2: 24 mmol/L (ref 22–32)
Calcium: 9 mg/dL (ref 8.9–10.3)
Chloride: 101 mmol/L (ref 98–111)
Creatinine, Ser: 0.92 mg/dL (ref 0.61–1.24)
GFR calc Af Amer: >60 mL/min (ref >60)
GFR calc non Af Amer: >60 mL/min (ref >60)
Glucose, Bld: 96 mg/dL (ref 70–99)
Potassium: 3.6 mmol/L (ref 3.5–5.1)
Sodium: 136 mmol/L (ref 135–145)

## 2018-12-14 LAB — CBC WITH DIFFERENTIAL/PLATELET
Abs Immature Granulocytes: 0.04 10*3/uL (ref 0.00–0.07)
Basophils Absolute: 0.1 10*3/uL (ref 0.0–0.1)
Basophils Relative: 1 %
Eosinophils Absolute: 0 10*3/uL (ref 0.0–0.5)
Eosinophils Relative: 0 %
HCT: 40.3 % (ref 39.0–52.0)
Hemoglobin: 13.5 g/dL (ref 13.0–17.0)
Immature Granulocytes: 0 %
Lymphocytes Relative: 19 %
Lymphs Abs: 2.3 10*3/uL (ref 0.7–4.0)
MCH: 30.8 pg (ref 26.0–34.0)
MCHC: 33.5 g/dL (ref 30.0–36.0)
MCV: 91.8 fL (ref 80.0–100.0)
Monocytes Absolute: 1.1 10*3/uL — ABNORMAL HIGH (ref 0.1–1.0)
Monocytes Relative: 10 %
Neutro Abs: 8.3 10*3/uL — ABNORMAL HIGH (ref 1.7–7.7)
Neutrophils Relative %: 70 %
Platelets: 208 10*3/uL (ref 150–400)
RBC: 4.39 MIL/uL (ref 4.22–5.81)
RDW: 14.9 % (ref 11.5–15.5)
WBC: 11.8 10*3/uL — ABNORMAL HIGH (ref 4.0–10.5)
nRBC: 0 % (ref 0.0–0.2)

## 2018-12-14 LAB — SARS CORONAVIRUS 2 BY RT PCR (HOSPITAL ORDER, PERFORMED IN ~~LOC~~ HOSPITAL LAB): SARS Coronavirus 2: NEGATIVE

## 2018-12-14 MED ORDER — PROCHLORPERAZINE EDISYLATE 10 MG/2ML IJ SOLN: 10.0000 mg | Freq: Four times a day (QID) | INTRAMUSCULAR | Status: DC | PRN

## 2018-12-14 MED ORDER — PROCHLORPERAZINE EDISYLATE 10 MG/2ML IJ SOLN
10.0000 mg | Freq: Once | INTRAMUSCULAR | Status: AC
  Administered 2018-12-14: 10 mg via INTRAVENOUS
  Filled 2018-12-14: qty 2

## 2018-12-14 MED ORDER — ACETAMINOPHEN 500 MG PO TABS
1000.0000 mg | ORAL_TABLET | Freq: Once | ORAL | Status: AC
  Administered 2018-12-14: 1000 mg via ORAL
  Filled 2018-12-14: qty 2

## 2018-12-14 MED ORDER — SODIUM CHLORIDE 0.9 % IV SOLN
3.0000 g | Freq: Four times a day (QID) | INTRAVENOUS | Status: DC
  Administered 2018-12-14 – 2018-12-17 (×10): 3 g via INTRAVENOUS
  Filled 2018-12-14 (×16): qty 3

## 2018-12-14 MED ORDER — POTASSIUM CHLORIDE IN NACL 20-0.45 MEQ/L-% IV SOLN
INTRAVENOUS | Status: DC
  Administered 2018-12-14 – 2018-12-16 (×4): via INTRAVENOUS
  Filled 2018-12-14 (×6): qty 1000

## 2018-12-14 MED ORDER — ACETAMINOPHEN 650 MG RE SUPP: 650.0000 mg | Freq: Four times a day (QID) | RECTAL | Status: DC | PRN

## 2018-12-14 MED ORDER — KETOROLAC TROMETHAMINE 30 MG/ML IJ SOLN
30.0000 mg | Freq: Once | INTRAMUSCULAR | Status: AC
  Administered 2018-12-14: 22:00:00 30 mg via INTRAVENOUS
  Filled 2018-12-14: qty 1

## 2018-12-14 MED ORDER — ACETAMINOPHEN 325 MG PO TABS
650.0000 mg | ORAL_TABLET | Freq: Four times a day (QID) | ORAL | Status: DC | PRN
  Administered 2018-12-15 – 2018-12-16 (×3): 650 mg via ORAL
  Filled 2018-12-14 (×3): qty 2

## 2018-12-14 MED ORDER — NICOTINE 21 MG/24HR TD PT24
21.0000 mg | MEDICATED_PATCH | Freq: Every day | TRANSDERMAL | Status: DC | PRN
  Administered 2018-12-15 – 2018-12-16 (×2): 21 mg via TRANSDERMAL
  Filled 2018-12-14 (×2): qty 1

## 2018-12-14 MED ORDER — SODIUM CHLORIDE 0.9 % IV SOLN
3.0000 g | Freq: Once | INTRAVENOUS | Status: AC
  Administered 2018-12-14: 3 g via INTRAVENOUS
  Filled 2018-12-14: qty 3

## 2018-12-14 MED ORDER — HYDROMORPHONE HCL 1 MG/ML IJ SOLN
1.0000 mg | Freq: Once | INTRAMUSCULAR | Status: AC
  Administered 2018-12-14: 1 mg via INTRAVENOUS
  Filled 2018-12-14: qty 1

## 2018-12-14 MED ORDER — IOHEXOL 350 MG/ML SOLN
75.0000 mL | Freq: Once | INTRAVENOUS | Status: AC | PRN
  Administered 2018-12-14: 75 mL via INTRAVENOUS

## 2018-12-14 MED ORDER — ORAL CARE MOUTH RINSE
15.0000 mL | Freq: Two times a day (BID) | OROMUCOSAL | Status: DC
  Administered 2018-12-15 – 2018-12-16 (×4): 15 mL via OROMUCOSAL

## 2018-12-14 MED ORDER — CHLORHEXIDINE GLUCONATE 0.12 % MT SOLN
15.0000 mL | Freq: Two times a day (BID) | OROMUCOSAL | Status: DC
  Administered 2018-12-15 – 2018-12-17 (×4): 15 mL via OROMUCOSAL
  Filled 2018-12-14 (×4): qty 15

## 2018-12-14 MED ORDER — CHLORHEXIDINE GLUCONATE CLOTH 2 % EX PADS
6.0000 | MEDICATED_PAD | Freq: Once | CUTANEOUS | Status: AC
  Administered 2018-12-14: 6 via TOPICAL

## 2018-12-14 MED ORDER — CHLORHEXIDINE GLUCONATE CLOTH 2 % EX PADS
6.0000 | MEDICATED_PAD | Freq: Once | CUTANEOUS | Status: AC
  Administered 2018-12-15: 08:00:00 6 via TOPICAL

## 2018-12-14 NOTE — ED Notes (Signed)
Report given to 6 N RN. All questions answered 

## 2018-12-14 NOTE — ED Notes (Signed)
COVID test collected by previous RN. Sent to lab

## 2018-12-14 NOTE — Progress Notes (Signed)
CSW received a call from ED Secretary stating the pt will be admitted but that the pt has his daughter with him whom is 37.  Pt states pt's daughter will be going to stay with the pt's daughter's mother and that the pt will call the pt's mother.  CSW offered to call the pt's daughter's mother for the pt but the pt refused and stated, "We're taking care of that now".  CSW will continue to follow for D/C needs.  Edwin Palmer. Edwin Redondo, LCSW, LCAS, CSI Transitions of Care Clinical Social Worker Care Coordination Department Ph: [phone]     '
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
## 2018-12-14 NOTE — Progress Notes (Signed)
CT reviewed. Multiple nonrestorable teeth left mandible, cyst left mandible, buccal space infection left mandible. Patient added to OR schedule for 12/15/2018. Will see patient in am for pre-op H and PE. [name], DMD [phone]
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
## 2018-12-14 NOTE — ED Provider Notes (Signed)
8:15 PM Spoke with Dr. Olevia Bowens, hospitalist. Agrees to admit the patient.   Lorayne Bender, PA-C 12/14/18 2023    Varney Biles, MD 12/15/18 1550
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
## 2018-12-15 ENCOUNTER — Inpatient Hospital Stay (HOSPITAL_COMMUNITY): Payer: Self-pay | Admitting: Anesthesiology

## 2018-12-15 ENCOUNTER — Encounter (HOSPITAL_COMMUNITY): Payer: Self-pay | Admitting: Orthopedic Surgery

## 2018-12-15 ENCOUNTER — Encounter (HOSPITAL_COMMUNITY)
Admission: EM | Disposition: A | Discharge status: Home / Self Care | Payer: Self-pay | Source: Home / Self Care | Attending: Internal Medicine

## 2018-12-15 DIAGNOSIS — M272 Inflammatory conditions of jaws: Principal | ICD-10-CM

## 2018-12-15 HISTORY — PX: MULTIPLE EXTRACTIONS WITH ALVEOLOPLASTY: SHX5342

## 2018-12-15 LAB — CBC WITH DIFFERENTIAL/PLATELET
Abs Immature Granulocytes: 0.05 10*3/uL (ref 0.00–0.07)
Basophils Absolute: 0.1 10*3/uL (ref 0.0–0.1)
Basophils Relative: 0 %
Eosinophils Absolute: 0.1 10*3/uL (ref 0.0–0.5)
Eosinophils Relative: 1 %
HCT: 37.5 % — ABNORMAL LOW (ref 39.0–52.0)
Hemoglobin: 12.5 g/dL — ABNORMAL LOW (ref 13.0–17.0)
Immature Granulocytes: 0 %
Lymphocytes Relative: 20 %
Lymphs Abs: 2.3 10*3/uL (ref 0.7–4.0)
MCH: 30.5 pg (ref 26.0–34.0)
MCHC: 33.3 g/dL (ref 30.0–36.0)
MCV: 91.5 fL (ref 80.0–100.0)
Monocytes Absolute: 1 10*3/uL (ref 0.1–1.0)
Monocytes Relative: 9 %
Neutro Abs: 8.3 10*3/uL — ABNORMAL HIGH (ref 1.7–7.7)
Neutrophils Relative %: 70 %
Platelets: 196 10*3/uL (ref 150–400)
RBC: 4.1 MIL/uL — ABNORMAL LOW (ref 4.22–5.81)
RDW: 14.7 % (ref 11.5–15.5)
WBC: 11.8 10*3/uL — ABNORMAL HIGH (ref 4.0–10.5)
nRBC: 0 % (ref 0.0–0.2)

## 2018-12-15 LAB — COMPREHENSIVE METABOLIC PANEL
ALT: 31 U/L (ref 0–44)
AST: 18 U/L (ref 15–41)
Albumin: 3.6 g/dL (ref 3.5–5.0)
Alkaline Phosphatase: 62 U/L (ref 38–126)
Anion gap: 10 (ref 5–15)
BUN: 5 mg/dL — ABNORMAL LOW (ref 6–20)
CO2: 25 mmol/L (ref 22–32)
Calcium: 8.6 mg/dL — ABNORMAL LOW (ref 8.9–10.3)
Chloride: 102 mmol/L (ref 98–111)
Creatinine, Ser: 0.87 mg/dL (ref 0.61–1.24)
GFR calc Af Amer: >60 mL/min (ref >60)
GFR calc non Af Amer: >60 mL/min (ref >60)
Glucose, Bld: 85 mg/dL (ref 70–99)
Potassium: 3.3 mmol/L — ABNORMAL LOW (ref 3.5–5.1)
Sodium: 137 mmol/L (ref 135–145)
Total Bilirubin: 1.7 mg/dL — ABNORMAL HIGH (ref 0.3–1.2)
Total Protein: 6.8 g/dL (ref 6.5–8.1)

## 2018-12-15 LAB — SURGICAL PCR SCREEN
MRSA, PCR: NEGATIVE
Staphylococcus aureus: NEGATIVE

## 2018-12-15 LAB — HIV ANTIBODY (ROUTINE TESTING W REFLEX): HIV Screen 4th Generation wRfx: NONREACTIVE

## 2018-12-15 LAB — MAGNESIUM: Magnesium: 1.7 mg/dL (ref 1.7–2.4)

## 2018-12-15 SURGERY — MULTIPLE EXTRACTION WITH ALVEOLOPLASTY
Anesthesia: General | Site: Mouth

## 2018-12-15 MED ORDER — ONDANSETRON HCL 4 MG/2ML IJ SOLN
INTRAMUSCULAR | Status: AC
  Filled 2018-12-15: qty 2

## 2018-12-15 MED ORDER — LIDOCAINE-EPINEPHRINE 2 %-1:100000 IJ SOLN
INTRAMUSCULAR | Status: DC | PRN
  Administered 2018-12-15: 10 mL via INTRADERMAL

## 2018-12-15 MED ORDER — PROPOFOL 10 MG/ML IV BOLUS
INTRAVENOUS | Status: DC | PRN
  Administered 2018-12-15: 200 mg via INTRAVENOUS

## 2018-12-15 MED ORDER — FENTANYL CITRATE (PF) 250 MCG/5ML IJ SOLN
INTRAMUSCULAR | Status: AC
  Filled 2018-12-15: qty 5

## 2018-12-15 MED ORDER — 0.9 % SODIUM CHLORIDE (POUR BTL) OPTIME
TOPICAL | Status: DC | PRN
  Administered 2018-12-15: 1000 mL

## 2018-12-15 MED ORDER — ENOXAPARIN SODIUM 40 MG/0.4ML ~~LOC~~ SOLN
40.0000 mg | SUBCUTANEOUS | Status: DC
  Filled 2018-12-15: qty 0.4

## 2018-12-15 MED ORDER — PHENYLEPHRINE 40 MCG/ML (10ML) SYRINGE FOR IV PUSH (FOR BLOOD PRESSURE SUPPORT)
PREFILLED_SYRINGE | INTRAVENOUS | Status: AC
  Filled 2018-12-15: qty 10

## 2018-12-15 MED ORDER — MIDAZOLAM HCL 5 MG/5ML IJ SOLN
INTRAMUSCULAR | Status: DC | PRN
  Administered 2018-12-15: 2 mg via INTRAVENOUS

## 2018-12-15 MED ORDER — SUCCINYLCHOLINE CHLORIDE 200 MG/10ML IV SOSY
PREFILLED_SYRINGE | INTRAVENOUS | Status: AC
  Filled 2018-12-15: qty 10

## 2018-12-15 MED ORDER — FENTANYL CITRATE (PF) 100 MCG/2ML IJ SOLN
INTRAMUSCULAR | Status: AC
  Filled 2018-12-15: qty 2

## 2018-12-15 MED ORDER — ROCURONIUM BROMIDE 10 MG/ML (PF) SYRINGE
PREFILLED_SYRINGE | INTRAVENOUS | Status: AC
  Filled 2018-12-15: qty 10

## 2018-12-15 MED ORDER — DEXAMETHASONE SODIUM PHOSPHATE 10 MG/ML IJ SOLN
INTRAMUSCULAR | Status: AC
  Filled 2018-12-15: qty 2

## 2018-12-15 MED ORDER — PROMETHAZINE HCL 25 MG/ML IJ SOLN: 6.2500 mg | INTRAMUSCULAR | Status: DC | PRN

## 2018-12-15 MED ORDER — LIDOCAINE-EPINEPHRINE 2 %-1:100000 IJ SOLN
INTRAMUSCULAR | Status: AC
  Filled 2018-12-15: qty 1

## 2018-12-15 MED ORDER — OXYCODONE HCL 5 MG PO TABS
5.0000 mg | ORAL_TABLET | Freq: Four times a day (QID) | ORAL | Status: DC | PRN
  Administered 2018-12-15 – 2018-12-16 (×3): 5 mg via ORAL
  Filled 2018-12-15 (×3): qty 1

## 2018-12-15 MED ORDER — POTASSIUM CHLORIDE CRYS ER 20 MEQ PO TBCR
40.0000 meq | EXTENDED_RELEASE_TABLET | ORAL | Status: AC
  Administered 2018-12-15 (×2): 40 meq via ORAL
  Filled 2018-12-15 (×2): qty 2

## 2018-12-15 MED ORDER — ONDANSETRON HCL 4 MG/2ML IJ SOLN
INTRAMUSCULAR | Status: DC | PRN
  Administered 2018-12-15: 4 mg via INTRAVENOUS

## 2018-12-15 MED ORDER — KETOROLAC TROMETHAMINE 30 MG/ML IJ SOLN
30.0000 mg | Freq: Once | INTRAMUSCULAR | Status: AC
  Administered 2018-12-15: 22:00:00 30 mg via INTRAVENOUS
  Filled 2018-12-15: qty 1

## 2018-12-15 MED ORDER — SODIUM CHLORIDE 0.9 % IR SOLN
Status: DC | PRN
  Administered 2018-12-15: 1000 mL

## 2018-12-15 MED ORDER — FENTANYL CITRATE (PF) 250 MCG/5ML IJ SOLN
INTRAMUSCULAR | Status: DC | PRN
  Administered 2018-12-15 (×3): 50 ug via INTRAVENOUS
  Administered 2018-12-15: 25 ug via INTRAVENOUS
  Administered 2018-12-15: 100 ug via INTRAVENOUS
  Administered 2018-12-15: 50 ug via INTRAVENOUS

## 2018-12-15 MED ORDER — SUCCINYLCHOLINE CHLORIDE 200 MG/10ML IV SOSY
PREFILLED_SYRINGE | INTRAVENOUS | Status: DC | PRN
  Administered 2018-12-15: 140 mg via INTRAVENOUS

## 2018-12-15 MED ORDER — MIDAZOLAM HCL 2 MG/2ML IJ SOLN
INTRAMUSCULAR | Status: AC
  Filled 2018-12-15: qty 2

## 2018-12-15 MED ORDER — EPHEDRINE 5 MG/ML INJ
INTRAVENOUS | Status: AC
  Filled 2018-12-15: qty 10

## 2018-12-15 MED ORDER — PROPOFOL 10 MG/ML IV BOLUS
INTRAVENOUS | Status: AC
  Filled 2018-12-15: qty 40

## 2018-12-15 MED ORDER — FENTANYL CITRATE (PF) 100 MCG/2ML IJ SOLN
25.0000 ug | INTRAMUSCULAR | Status: DC | PRN
  Administered 2018-12-15: 25 ug via INTRAVENOUS

## 2018-12-15 MED ORDER — LIDOCAINE 2% (20 MG/ML) 5 ML SYRINGE
INTRAMUSCULAR | Status: AC
  Filled 2018-12-15: qty 5

## 2018-12-15 MED ORDER — LACTATED RINGERS IV SOLN
INTRAVENOUS | Status: DC
  Administered 2018-12-15: 08:00:00 via INTRAVENOUS

## 2018-12-15 MED ORDER — DEXAMETHASONE SODIUM PHOSPHATE 10 MG/ML IJ SOLN
INTRAMUSCULAR | Status: DC | PRN
  Administered 2018-12-15: 10 mg via INTRAVENOUS

## 2018-12-15 MED ORDER — LIDOCAINE 2% (20 MG/ML) 5 ML SYRINGE
INTRAMUSCULAR | Status: DC | PRN
  Administered 2018-12-15: 100 mg via INTRAVENOUS

## 2018-12-15 SURGICAL SUPPLY — 38 items
BUR CROSS CUT FISSURE 1.6 (BURR) ×2 IMPLANT
BUR CROSS CUT FISSURE 1.6MM (BURR) ×1
BUR EGG ELITE 4.0 (BURR) ×2 IMPLANT
BUR EGG ELITE 4.0MM (BURR) ×1
CANISTER SUCT 3000ML PPV (MISCELLANEOUS) ×3 IMPLANT
COVER SURGICAL LIGHT HANDLE (MISCELLANEOUS) ×3 IMPLANT
COVER WAND RF STERILE (DRAPES) ×3 IMPLANT
DECANTER SPIKE VIAL GLASS SM (MISCELLANEOUS) IMPLANT
DRAPE U-SHAPE 76X120 STRL (DRAPES) ×3 IMPLANT
GAUZE PACKING FOLDED 2  STR (GAUZE/BANDAGES/DRESSINGS) ×2
GAUZE PACKING FOLDED 2 STR (GAUZE/BANDAGES/DRESSINGS) ×1 IMPLANT
GLOVE BIO SURGEON STRL SZ 6.5 (GLOVE) IMPLANT
GLOVE BIO SURGEON STRL SZ7.5 (GLOVE) ×3 IMPLANT
GLOVE BIO SURGEONS STRL SZ 6.5 (GLOVE)
GLOVE BIOGEL PI IND STRL 6.5 (GLOVE) IMPLANT
GLOVE BIOGEL PI IND STRL 7.0 (GLOVE) IMPLANT
GLOVE BIOGEL PI INDICATOR 6.5 (GLOVE)
GLOVE BIOGEL PI INDICATOR 7.0 (GLOVE)
GOWN STRL REUS W/ TWL LRG LVL3 (GOWN DISPOSABLE) ×1 IMPLANT
GOWN STRL REUS W/ TWL XL LVL3 (GOWN DISPOSABLE) ×1 IMPLANT
GOWN STRL REUS W/TWL LRG LVL3 (GOWN DISPOSABLE) ×2
GOWN STRL REUS W/TWL XL LVL3 (GOWN DISPOSABLE) ×2
IV NS 1000ML (IV SOLUTION) ×2
IV NS 1000ML BAXH (IV SOLUTION) ×1 IMPLANT
KIT BASIN OR (CUSTOM PROCEDURE TRAY) ×3 IMPLANT
KIT TURNOVER KIT B (KITS) ×3 IMPLANT
NEEDLE 22X1 1/2 (OR ONLY) (NEEDLE) ×6 IMPLANT
NEEDLE 27GAX1X1/2 (NEEDLE) IMPLANT
NS IRRIG 1000ML POUR BTL (IV SOLUTION) ×3 IMPLANT
PAD ARMBOARD 7.5X6 YLW CONV (MISCELLANEOUS) ×3 IMPLANT
POSITIONER HEAD DONUT 9IN (MISCELLANEOUS) ×3 IMPLANT
SLEEVE IRRIGATION ELITE 7 (MISCELLANEOUS) ×3 IMPLANT
SPONGE SURGIFOAM ABS GEL 12-7 (HEMOSTASIS) IMPLANT
SUT CHROMIC 3 0 PS 2 (SUTURE) ×3 IMPLANT
SYR CONTROL 10ML LL (SYRINGE) ×3 IMPLANT
TRAY ENT MC OR (CUSTOM PROCEDURE TRAY) ×3 IMPLANT
TUBING IRRIGATION (MISCELLANEOUS) ×3 IMPLANT
YANKAUER SUCT BULB TIP NO VENT (SUCTIONS) ×3 IMPLANT

## 2018-12-15 NOTE — Anesthesia Procedure Notes (Signed)
Procedure Name: Intubation Date/Time: 12/15/2018 8:53 AM Performed by: Shirlyn Goltz, CRNA Pre-anesthesia Checklist: Patient identified, Emergency Drugs available, Suction available and Patient being monitored Patient Re-evaluated:Patient Re-evaluated prior to induction Oxygen Delivery Method: Circle system utilized Preoxygenation: Pre-oxygenation with 100% oxygen Induction Type: IV induction Ventilation: Mask ventilation without difficulty Laryngoscope Size: Glidescope and 4 Grade View: Grade I Tube type: Oral Tube size: 7.5 mm Number of attempts: 1 Airway Equipment and Method: Rigid stylet and Video-laryngoscopy Placement Confirmation: ETT inserted through vocal cords under direct vision,  positive ETCO2 and breath sounds checked- equal and bilateral Secured at: 20 cm Tube secured with: Tape Dental Injury: Teeth and Oropharynx as per pre-operative assessment

## 2018-12-15 NOTE — Op Note (Signed)
NAMEKENDEL, BESSEY MEDICAL RECORD [id number] ACCOUNT [id number] DATE OF BIRTH:[birth year] FACILITY: [facility] LOCATION: [facility] PHYSICIAN:Kaicen Desena M. Brittan Mapel, DDS  OPERATIVE REPORT  DATE OF PROCEDURE:  12/15/2018  PREOPERATIVE DIAGNOSES:  Nonrestorable teeth, left mandible; cyst, left mandible; buccal space infection, left mandible.  POSTOPERATIVE DIAGNOSIS:   Nonrestorable teeth, left mandible; cyst, left mandible; buccal space infection, left mandible.  PROCEDURE:  Extraction of teeth numbers 18, 19, and 20.  Incision and drainage of left buccal vestibule of the mandible.  Removal of cyst, left mandible.  SURGEON:  Diona Browner, DDS  ANESTHESIA:  Leroy Sea attending, oral intubation.  DESCRIPTION OF PROCEDURE:  The patient was taken to the operating room and placed on the table in supine position.  General anesthesia was administered intravenously, and an oral endotracheal tube was placed.  The eyes were protected.  The patient was  draped for surgery.  A timeout was performed.  The posterior pharynx was suctioned and a throat pack was placed.  Lidocaine 2% at 1:100,000 epinephrine was infiltrated in the left inferior alveolar block and in buccal and lingual infiltration of the left  mandible.  A #15 blade was used to make the incision in the buccal vestibule.  Approximately 1 mL of purulent material was expressed.  Cultures were taken, aerobic and anaerobic.  Then, the 15 blade was used to make an incision around tooth numbers 18,  19 and 20.  The teeth were elevated with a 301 elevator and removed from the mouth with the dental forceps and rongeurs.  Then, the cystic lesion under tooth #18 and #19 was curetted.  It was somewhat adherent to the walls of the cyst, but it was  removed.  It was approximately 1.2 cm x 1.2 cm in dimension.  Then, the socket was curetted, irrigated, and closed with 3-0 chromic.  Then, the buccal incision site was explored with hemostats.  No additional  purulent material was expressed.  The area  was irrigated.  A 1/4-inch Penrose drain was placed and sutured to the mucosa with 3-0 silk.  Then, the oral cavity was irrigated and suctioned.  Throat pack was removed.  The patient was left in the care of anesthesia for extubation with plans for  recovery room and taken back to the floor for postoperative care and IV antibiotics.  ESTIMATED BLOOD LOSS:  Minimal.  COMPLICATIONS:  None.  SPECIMENS:  Cyst, left mandible; aerobic and anaerobic culture left buccal vestibule infection, left mandible.  LN/NUANCE  D:12/15/2018 T:12/15/2018 JOB:[id number]

## 2018-12-15 NOTE — Op Note (Signed)
12/15/2018  9:16 AM  PATIENT:  Edwin Palmer  38 y.o. male  PRE-OPERATIVE DIAGNOSIS:  Non-restorable teeth left mandible, cyst left mandible, buccal space infection left   POST-OPERATIVE DIAGNOSIS:  SAME  PROCEDURE:  Procedure(s):  EXTRACTION TEETH # 18, 19, 20, INCISION AND DRAINAGE  LEFT BUCCAL VESTIBULE OF MANDIBLE, REMOVAL CYST LEFT MANDIBLE  SURGEON:  Surgeon(s): Diona Browner, DDS  ANESTHESIA:   local and general  EBL:  minimal  DRAINS: none   SPECIMEN:  CYST LEFT MANDIBLE, AEROBIC/ANAEROBIC CULTURES  COUNTS:  YES  PLAN OF CARE: RETURN TO FLOOR FOR IV ANTIBIOTICS  PATIENT DISPOSITION:  PACU - hemodynamically stable.   PROCEDURE DETAILS: Dictation # [id number] [name], DMD 12/15/2018 9:16 AM

## 2018-12-15 NOTE — Anesthesia Preprocedure Evaluation (Addendum)
Anesthesia Evaluation  Patient identified by MRN, date of birth, ID band Patient awake    Reviewed: Allergy & Precautions, NPO status , Patient's Chart, lab work & pertinent test results  History of Anesthesia Complications Negative for: history of anesthetic complications  Airway Mallampati: III  TM Distance: >3 FB Neck ROM: Full    Dental no notable dental hx. (+) Dental Advisory Given   Pulmonary Current Smoker,    Pulmonary exam normal        Cardiovascular negative cardio ROS Normal cardiovascular exam     Neuro/Psych negative neurological ROS  negative psych ROS   GI/Hepatic negative GI ROS, Neg liver ROS,   Endo/Other  negative endocrine ROS  Renal/GU negative Renal ROS  negative genitourinary   Musculoskeletal negative musculoskeletal ROS (+)   Abdominal   Peds negative pediatric ROS (+)  Hematology negative hematology ROS (+)   Anesthesia Other Findings   Reproductive/Obstetrics negative OB ROS                           Anesthesia Physical Anesthesia Plan  ASA: III  Anesthesia Plan: General   Post-op Pain Management:    Induction: Intravenous, Rapid sequence and Cricoid pressure planned  PONV Risk Score and Plan: 2 and Ondansetron and Dexamethasone  Airway Management Planned: Oral ETT and Video Laryngoscope Planned  Additional Equipment:   Intra-op Plan:   Post-operative Plan: Extubation in OR  Informed Consent: I have reviewed the patients History and Physical, chart, labs and discussed the procedure including the risks, benefits and alternatives for the proposed anesthesia with the patient or authorized representative who has indicated his/her understanding and acceptance.     Dental advisory given  Plan Discussed with: CRNA, Anesthesiologist and Surgeon  Anesthesia Plan Comments:        Anesthesia Quick Evaluation
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
## 2018-12-15 NOTE — Transfer of Care (Signed)
Immediate Anesthesia Transfer of Care Note  Patient: Edwin Palmer  Procedure(s) Performed: MULTIPLE EXTRACTION WITH ALVEOLOPLASTY , INCISION AND DRAINAGE ABCESS (N/A Mouth)  Patient Location: PACU  Anesthesia Type:General  Level of Consciousness: awake, alert , oriented and patient cooperative  Airway & Oxygen Therapy: Patient Spontanous Breathing and Patient connected to nasal cannula oxygen  Post-op Assessment: Report given to RN and Post -op Vital signs reviewed and stable  Post vital signs: Reviewed and stable  Last Vitals:  Vitals Value Taken Time  BP 79/44 12/15/18 0932  Temp    Pulse 109 12/15/18 0932  Resp 24 12/15/18 0932  SpO2 96 % 12/15/18 0932  Vitals shown include unvalidated device data.  Last Pain:  Vitals:   12/15/18 0723  TempSrc:   PainSc: 10-Worst pain ever      Patients Stated Pain Goal: 4 (74/12/87 8676)  Complications: No apparent anesthesia complications

## 2018-12-16 DIAGNOSIS — K047 Periapical abscess without sinus: Secondary | ICD-10-CM

## 2018-12-16 DIAGNOSIS — E669 Obesity, unspecified: Secondary | ICD-10-CM

## 2018-12-16 LAB — CBC WITH DIFFERENTIAL/PLATELET
Abs Immature Granulocytes: 0.05 10*3/uL (ref 0.00–0.07)
Basophils Absolute: 0 10*3/uL (ref 0.0–0.1)
Basophils Relative: 0 %
Eosinophils Absolute: 0 10*3/uL (ref 0.0–0.5)
Eosinophils Relative: 0 %
HCT: 35.9 % — ABNORMAL LOW (ref 39.0–52.0)
Hemoglobin: 12.4 g/dL — ABNORMAL LOW (ref 13.0–17.0)
Immature Granulocytes: 1 %
Lymphocytes Relative: 18 %
Lymphs Abs: 2 10*3/uL (ref 0.7–4.0)
MCH: 30.7 pg (ref 26.0–34.0)
MCHC: 34.5 g/dL (ref 30.0–36.0)
MCV: 88.9 fL (ref 80.0–100.0)
Monocytes Absolute: 0.9 10*3/uL (ref 0.1–1.0)
Monocytes Relative: 8 %
Neutro Abs: 7.8 10*3/uL — ABNORMAL HIGH (ref 1.7–7.7)
Neutrophils Relative %: 73 %
Platelets: 166 10*3/uL (ref 150–400)
RBC: 4.04 MIL/uL — ABNORMAL LOW (ref 4.22–5.81)
RDW: 14.3 % (ref 11.5–15.5)
WBC: 10.7 10*3/uL — ABNORMAL HIGH (ref 4.0–10.5)
nRBC: 0 % (ref 0.0–0.2)

## 2018-12-16 LAB — RENAL FUNCTION PANEL
Albumin: 3.1 g/dL — ABNORMAL LOW (ref 3.5–5.0)
Anion gap: 12 (ref 5–15)
BUN: 5 mg/dL — ABNORMAL LOW (ref 6–20)
CO2: 21 mmol/L — ABNORMAL LOW (ref 22–32)
Calcium: 8.6 mg/dL — ABNORMAL LOW (ref 8.9–10.3)
Chloride: 105 mmol/L (ref 98–111)
Creatinine, Ser: 0.77 mg/dL (ref 0.61–1.24)
GFR calc Af Amer: >60 mL/min (ref >60)
GFR calc non Af Amer: >60 mL/min (ref >60)
Glucose, Bld: 95 mg/dL (ref 70–99)
Phosphorus: 2.5 mg/dL (ref 2.5–4.6)
Potassium: 4.4 mmol/L (ref 3.5–5.1)
Sodium: 138 mmol/L (ref 135–145)

## 2018-12-16 LAB — MAGNESIUM: Magnesium: 2 mg/dL (ref 1.7–2.4)

## 2018-12-16 MED ORDER — OXYCODONE HCL 5 MG PO TABS
10.0000 mg | ORAL_TABLET | Freq: Four times a day (QID) | ORAL | Status: DC | PRN
  Administered 2018-12-16 – 2018-12-17 (×4): 10 mg via ORAL
  Filled 2018-12-16 (×4): qty 2

## 2018-12-16 MED ORDER — IBUPROFEN 200 MG PO TABS
200.0000 mg | ORAL_TABLET | Freq: Four times a day (QID) | ORAL | Status: DC | PRN
  Administered 2018-12-16: 200 mg via ORAL
  Filled 2018-12-16 (×2): qty 1

## 2018-12-16 MED ORDER — ALPRAZOLAM 0.25 MG PO TABS
0.2500 mg | ORAL_TABLET | Freq: Once | ORAL | Status: AC
  Administered 2018-12-16: 0.25 mg via ORAL
  Filled 2018-12-16: qty 1
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
## 2018-12-16 NOTE — Plan of Care (Signed)
  Problem: Activity: Goal: Risk for activity intolerance will decrease Outcome: Progressing   Problem: Nutrition: Goal: Adequate nutrition will be maintained Outcome: Progressing   Problem: Coping: Goal: Level of anxiety will decrease Outcome: Progressing   Problem: Pain Managment: Goal: General experience of comfort will improve Outcome: Progressing   

## 2018-12-16 NOTE — Progress Notes (Signed)
Edwin Palmer PROGRESS NOTE:   SUBJECTIVE: POD 1. C/o pain 9/10. Swelling feels less  OBJECTIVE:  Vitals: Blood pressure 121/79, pulse 89, temperature 98.3 F (36.8 C), temperature source Oral, resp. rate 18, height 5\' 7"  (1.702 m), weight 106.2 kg, SpO2 100 %. Lab results: Results for orders placed or performed during the hospital encounter of 12/14/18 (from the past 24 hour(s))  Magnesium     Status: None   Collection Time: 12/15/18  4:37 PM  Result Value Ref Range   Magnesium 1.7 1.7 - 2.4 mg/dL  Renal function panel     Status: Abnormal   Collection Time: 12/16/18  6:52 AM  Result Value Ref Range   Sodium 138 135 - 145 mmol/L   Potassium 4.4 3.5 - 5.1 mmol/L   Chloride 105 98 - 111 mmol/L   CO2 21 (L) 22 - 32 mmol/L   Glucose, Bld 95 70 - 99 mg/dL   BUN 5 (L) 6 - 20 mg/dL   Creatinine, Ser 0.77 0.61 - 1.24 mg/dL   Calcium 8.6 (L) 8.9 - 10.3 mg/dL   Phosphorus 2.5 2.5 - 4.6 mg/dL   Albumin 3.1 (L) 3.5 - 5.0 g/dL   GFR calc non Af Amer >60 >60 mL/min   GFR calc Af Amer >60 >60 mL/min   Anion gap 12 5 - 15  Magnesium     Status: None   Collection Time: 12/16/18  6:52 AM  Result Value Ref Range   Magnesium 2.0 1.7 - 2.4 mg/dL   Radiology Results: Ct Soft Tissue Neck W Contrast  Result Date: 12/14/2018 CLINICAL DATA:  Initial evaluation for acute dental pain. EXAM: CT NECK WITH CONTRAST TECHNIQUE: Multidetector CT imaging of the neck was performed using the standard protocol following the bolus administration of intravenous contrast. CONTRAST:  43mL OMNIPAQUE IOHEXOL 350 MG/ML SOLN COMPARISON:  None. FINDINGS: Pharynx and larynx: Oral cavity within normal limits. Poor dentition with innumerable dental caries and periapical lucencies noted. Prominent 13 mm periapical lucency/abscess present at the left mandibular body near the base of the left second bicuspid/first molar (series 7, image 51). Dehiscence of the overlying alveolar ridge. Extensive inflammatory stranding with swelling  within the adjacent left masticator space, consistent with associated cellulitis. Extension into the pre maxillary soft tissues as well as the left parotid space. Further extension into the submental region with associated irregularity and thickening of the platysmas musculature. Superimposed somewhat ill-defined hypodense collection measuring 3.2 x 2.4 x 2.2 cm consistent with abscess (series 9, image 32). No significant extension of inflammatory changes into the deeper spaces of the neck at this time. Palatine tonsils within normal limits. Parapharyngeal fat maintained. Remainder of the oropharynx and nasopharynx within normal limits. No retropharyngeal collection. Epiglottis normal. Vallecula clear. Remainder of the hypopharynx and supraglottic larynx within normal limits. True cords symmetric and normal. Subglottic airway clear. Salivary glands: Hazy inflammatory changes surround the left parotid gland related to the inflammatory process within the left face. Salivary glands otherwise within normal limits. Thyroid: Normal. Lymph nodes: Prominent level 1 a nodes measure up to 12 mm. Left level 1 B node measures 13 mm. Left level 2 node measures 16 mm. Findings presumably reactive. No other adenopathy within the neck. Vascular: Normal intravascular enhancement seen throughout the neck. Limited intracranial: Unremarkable. Visualized orbits: Visualized globes and orbital soft tissues within normal limits. No postseptal or intraorbital cellulitis. Mastoids and visualized paranasal sinuses: Few scattered bilateral maxillary sinus retention cyst. Paranasal sinuses are otherwise largely clear. Mastoid air cells  and middle ear cavities are well pneumatized and free of fluid. Skeleton: No acute osseous abnormality. No discrete lytic or blastic osseous lesions. Upper chest: Visualized upper chest demonstrates no acute finding. Partially visualized lungs are clear. Other: None. IMPRESSION: 1. Prominent 13 mm periapical  lucency/abscess at the left mandibular body as above. Associated extensive inflammatory stranding with swelling within the adjacent left face, compatible with associated cellulitis. Superimposed 3.2 x 2.4 x 2.2 cm odontogenic abscess as above. 2. Enlarged left-sided cervical adenopathy as above, likely reactive. Electronically Signed   By: Rise MuBenjamin  McClintock M.D.   On: 12/14/2018 17:26   General appearance: alert, cooperative and no distress Head: Normocephalic, without obvious abnormality, atraumatic Eyes: negative Nose: Nares normal. Septum midline. Mucosa normal. No drainage or sinus tenderness. Throat: mild soft edema left mandibular. Sutures/drain intact left buccal space. Pharynx clear. Minimal trismus.  Neck: no adenopathy, supple, symmetrical, trachea midline and thyroid not enlarged, symmetric, no tenderness/mass/nodules Resp: clear to auscultation bilaterally Cardio: regular rate and rhythm, S1, S2 normal, no murmur, click, rub or gallop  ASSESSMENT: Stable s/p removal teeth, cyst, incision and drainage.  PLAN: Continue IV antibiotics, pain management.    Edwin Palmer Edwin Palmer 12/16/2018

## 2018-12-17 ENCOUNTER — Encounter (HOSPITAL_COMMUNITY): Payer: Self-pay | Admitting: Oral Surgery

## 2018-12-17 DIAGNOSIS — Z720 Tobacco use: Secondary | ICD-10-CM

## 2018-12-17 LAB — CBC
HCT: 37.7 % — ABNORMAL LOW (ref 39.0–52.0)
Hemoglobin: 12.4 g/dL — ABNORMAL LOW (ref 13.0–17.0)
MCH: 30.4 pg (ref 26.0–34.0)
MCHC: 32.9 g/dL (ref 30.0–36.0)
MCV: 92.4 fL (ref 80.0–100.0)
Platelets: 218 10*3/uL (ref 150–400)
RBC: 4.08 MIL/uL — ABNORMAL LOW (ref 4.22–5.81)
RDW: 14.6 % (ref 11.5–15.5)
WBC: 7.4 10*3/uL (ref 4.0–10.5)
nRBC: 0 % (ref 0.0–0.2)

## 2018-12-17 LAB — BASIC METABOLIC PANEL
Anion gap: 9 (ref 5–15)
BUN: 6 mg/dL (ref 6–20)
CO2: 23 mmol/L (ref 22–32)
Calcium: 8.8 mg/dL — ABNORMAL LOW (ref 8.9–10.3)
Chloride: 107 mmol/L (ref 98–111)
Creatinine, Ser: 0.72 mg/dL (ref 0.61–1.24)
GFR calc Af Amer: >60 mL/min (ref >60)
GFR calc non Af Amer: >60 mL/min (ref >60)
Glucose, Bld: 79 mg/dL (ref 70–99)
Potassium: 4.3 mmol/L (ref 3.5–5.1)
Sodium: 139 mmol/L (ref 135–145)

## 2018-12-17 MED ORDER — AMOXICILLIN-POT CLAVULANATE 875-125 MG PO TABS: 1.0000 | ORAL_TABLET | Freq: Two times a day (BID) | ORAL | 0 refills | Status: AC

## 2018-12-17 MED ORDER — OXYCODONE HCL 5 MG PO TABS: 15.0000 mg | ORAL_TABLET | Freq: Four times a day (QID) | ORAL | Status: DC | PRN

## 2018-12-17 MED ORDER — OXYCODONE HCL 15 MG PO TABS: 15.0000 mg | ORAL_TABLET | Freq: Four times a day (QID) | ORAL | 0 refills | Status: AC | PRN

## 2018-12-17 NOTE — Care Management (Signed)
Patient given [facility] letter to get prescriptions filled with $3 copay for a total of $6.  He states he can pay this.  Override entered for narcotic.  Patient is aware he can take prescriptions to any pharmacy on [facility] list.  RN aware.

## 2018-12-17 NOTE — Plan of Care (Signed)

## 2018-12-17 NOTE — Progress Notes (Signed)
Pt discharged home in stable condition after going over discharge instructions with no concerns voiced 
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
## 2018-12-17 NOTE — Discharge Instructions (Signed)
Follow with Dr. Barbette MerinoJensen as scheduled  Please get a complete blood count and chemistry panel checked by your Primary MD at your next visit, and again as instructed by your Primary MD. Please get your medications reviewed and adjusted by your Primary MD.  Please request your Primary MD to go over all Hospital Tests and Procedure/Radiological results at the follow up, please get all Hospital records sent to your Prim MD by signing hospital release before you go home.  In some cases, there will be blood work, cultures and biopsy results pending at the time of your discharge. Please request that your primary care M.D. goes through all the records of your hospital data and follows up on these results.  If you had Pneumonia of Lung problems at the Hospital: Please get a 2 view Chest X ray done in 6-8 weeks after hospital discharge or sooner if instructed by your Primary MD.  If you have Congestive Heart Failure: Please call your Cardiologist or Primary MD anytime you have any of the following symptoms:  1) 3 pound weight gain in 24 hours or 5 pounds in 1 week  2) shortness of breath, with or without a dry hacking cough  3) swelling in the hands, feet or stomach  4) if you have to sleep on extra pillows at night in order to breathe  Follow cardiac low salt diet and 1.5 lit/day fluid restriction.  If you have diabetes Accuchecks 4 times/day, Once in AM empty stomach and then before each meal. Log in all results and show them to your primary doctor at your next visit. If any glucose reading is under 80 or above 300 call your primary MD immediately.  If you have Seizure/Convulsions/Epilepsy: Please do not drive, operate heavy machinery, participate in activities at heights or participate in high speed sports until you have seen by Primary MD or a Neurologist and advised to do so again. Per [facility] statutes, patients with seizures are not allowed to drive until they have been seizure-free  for six months.  Use caution when using heavy equipment or power tools. Avoid working on ladders or at heights. Take showers instead of baths. Ensure the water temperature is not too high on the home water heater. Do not go swimming alone. Do not lock yourself in a room alone (i.e. bathroom). When caring for infants or small children, sit down when holding, feeding, or changing them to minimize risk of injury to the child in the event you have a seizure. Maintain good sleep hygiene. Avoid alcohol.   If you had Gastrointestinal Bleeding: Please ask your Primary MD to check a complete blood count within one week of discharge or at your next visit. Your endoscopic/colonoscopic biopsies that are pending at the time of discharge, will also need to followed by your Primary MD.  Get Medicines reviewed and adjusted. Please take all your medications with you for your next visit with your Primary MD  Please request your Primary MD to go over all hospital tests and procedure/radiological results at the follow up, please ask your Primary MD to get all Hospital records sent to his/her office.  If you experience worsening of your admission symptoms, develop shortness of breath, life threatening emergency, suicidal or homicidal thoughts you must seek medical attention immediately by calling 911 or calling your MD immediately  if symptoms less severe.  You must read complete instructions/literature along with all the possible adverse reactions/side effects for all the Medicines you take and that  have been prescribed to you. Take any new Medicines after you have completely understood and accpet all the possible adverse reactions/side effects.   Do not drive or operate heavy machinery when taking Pain medications.   Do not take more than prescribed Pain, Sleep and Anxiety Medications  Special Instructions: If you have smoked or chewed Tobacco  in the last 2 yrs please stop smoking, stop any regular Alcohol  and or  any Recreational drug use.  Wear Seat belts while driving.  Please note You were cared for by a hospitalist during your hospital stay. If you have any questions about your discharge medications or the care you received while you were in the hospital after you are discharged, you can call the unit and asked to speak with the hospitalist on call if the hospitalist that took care of you is not available. Once you are discharged, your primary care physician will handle any further medical issues. Please note that NO REFILLS for any discharge medications will be authorized once you are discharged, as it is imperative that you return to your primary care physician (or establish a relationship with a primary care physician if you do not have one) for your aftercare needs so that they can reassess your need for medications and monitor your lab values.  You can reach the hospitalist office at phone [phone] or fax [phone]   If you do not have a primary care physician, you can call [phone] for a physician referral.  Activity: As tolerated with Full fall precautions use walker/cane & assistance as needed    Diet: soft, as tolerated  Disposition Home

## 2018-12-17 NOTE — Progress Notes (Signed)
[facility] PROGRESS NOTE:   SUBJECTIVE: Feeling better. Still with pain left jaw.  OBJECTIVE:  Vitals: Blood pressure 120/81, pulse 70, temperature 97.8 F (36.6 C), temperature source Oral, resp. rate 17, height 5\' 7"  (1.702 m), weight 106.2 kg, SpO2 98 %. Lab results: Results for orders placed or performed during the hospital encounter of 12/14/18 (from the past 24 hour(s))  CBC     Status: Abnormal   Collection Time: 12/17/18  3:12 AM  Result Value Ref Range   WBC 7.4 4.0 - 10.5 K/uL   RBC 4.08 (L) 4.22 - 5.81 MIL/uL   Hemoglobin 12.4 (L) 13.0 - 17.0 g/dL   HCT 37.7 (L) 39.0 - 52.0 %   MCV 92.4 80.0 - 100.0 fL   MCH 30.4 26.0 - 34.0 pg   MCHC 32.9 30.0 - 36.0 g/dL   RDW 14.6 11.5 - 15.5 %   Platelets 218 150 - 400 K/uL   nRBC 0.0 0.0 - 0.2 %  Basic metabolic panel     Status: Abnormal   Collection Time: 12/17/18  3:12 AM  Result Value Ref Range   Sodium 139 135 - 145 mmol/L   Potassium 4.3 3.5 - 5.1 mmol/L   Chloride 107 98 - 111 mmol/L   CO2 23 22 - 32 mmol/L   Glucose, Bld 79 70 - 99 mg/dL   BUN 6 6 - 20 mg/dL   Creatinine, Ser 0.72 0.61 - 1.24 mg/dL   Calcium 8.8 (L) 8.9 - 10.3 mg/dL   GFR calc non Af Amer >60 >60 mL/min   GFR calc Af Amer >60 >60 mL/min   Anion gap 9 5 - 15   Radiology Results: No results found. General appearance: alert, cooperative and no distress Head: Normocephalic, without obvious abnormality, atraumatic Eyes: negative Nose: Nares normal. Septum midline. Mucosa normal. No drainage or sinus tenderness. Throat: trismus without change, approximate MIO 155mm. Drain intact, no active purulence. Pharynx clear.  Neck: no adenopathy, supple, symmetrical, trachea midline, thyroid not enlarged, symmetric, no tenderness/mass/nodules and mild edema left submandibular, non-fluctuant Resp: clear to auscultation bilaterally Cardio: regular rate and rhythm, S1, S2 normal, no murmur, click, rub or gallop  ASSESSMENT: Improved s/p extractions, Incision  and drainage. White count trending down. Ambulating well, good po intake  PLAN: Stable for D/C home. Will remove drain in office 12/18/2018. PO antibioitics, pain meds.   Diona Browner 12/17/2018

## 2018-12-20 LAB — AEROBIC/ANAEROBIC CULTURE W GRAM STAIN (SURGICAL/DEEP WOUND): Culture: NORMAL

## 2018-12-20 NOTE — Anesthesia Postprocedure Evaluation (Signed)
Anesthesia Post Note  Patient: Edwin Palmer  Procedure(s) Performed: MULTIPLE EXTRACTION WITH ALVEOLOPLASTY , INCISION AND DRAINAGE ABCESS (N/A Mouth)     Patient location during evaluation: PACU Anesthesia Type: General Level of consciousness: sedated Pain management: pain level controlled Vital Signs Assessment: post-procedure vital signs reviewed and stable Respiratory status: spontaneous breathing and respiratory function stable Cardiovascular status: stable Postop Assessment: no apparent nausea or vomiting Anesthetic complications: no                 Cynethia Schindler DANIEL

## 2019-04-12 ENCOUNTER — Other Ambulatory Visit: Payer: Self-pay

## 2019-04-12 ENCOUNTER — Ambulatory Visit (HOSPITAL_COMMUNITY)
Admission: RE | Admit: 2019-04-12 | Discharge: 2019-04-12 | Disposition: A | Discharge status: Home / Self Care | Payer: Self-pay | Source: Ambulatory Visit | Attending: Occupational Medicine | Admitting: Occupational Medicine

## 2019-04-12 ENCOUNTER — Other Ambulatory Visit (HOSPITAL_COMMUNITY): Payer: Self-pay | Admitting: Occupational Medicine

## 2019-04-12 DIAGNOSIS — R7611 Nonspecific reaction to tuberculin skin test without active tuberculosis: Secondary | ICD-10-CM

## 2021-02-19 IMAGING — CT CT NECK WITH CONTRAST
4 of 6 series · 12 of 33 positions shown, 14 images · IV contrast (APPLIED)
Comparison: None.

CLINICAL DATA: Initial evaluation for acute dental pain.

EXAM:
CT NECK WITH CONTRAST
TECHNIQUE: Multidetector CT imaging of the neck was performed using the
standard protocol following the bolus administration of intravenous
contrast.
CONTRAST:  75mL OMNIPAQUE IOHEXOL 350 MG/ML SOLN

[Series 7: axial bone · axial · 0.49mm/px · z∈[-183,-99]mm · 2 of 127 slices shown, 3 images]
[im 43/127  soft-tissue]
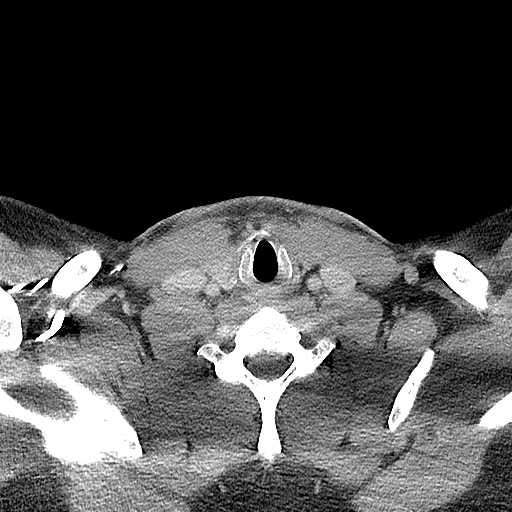
[im 43/127  bone]
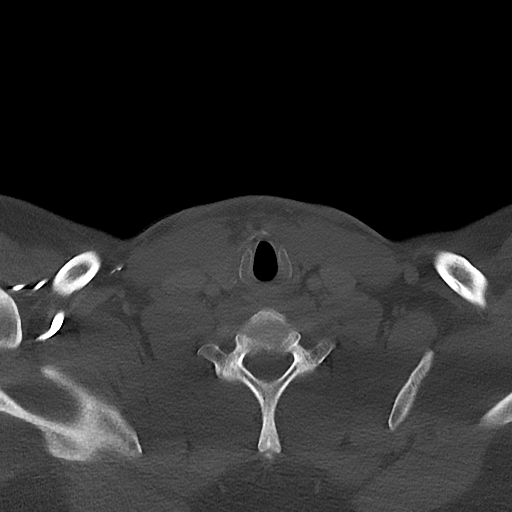
[im 85/127  bone]
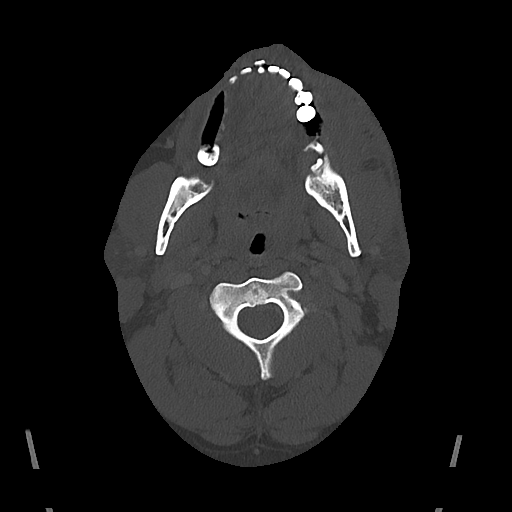

[Series 8: sag neck · sagittal · 0.55mm/px · 5 of 101 slices shown, 6 images]
[im 34/101  bone]
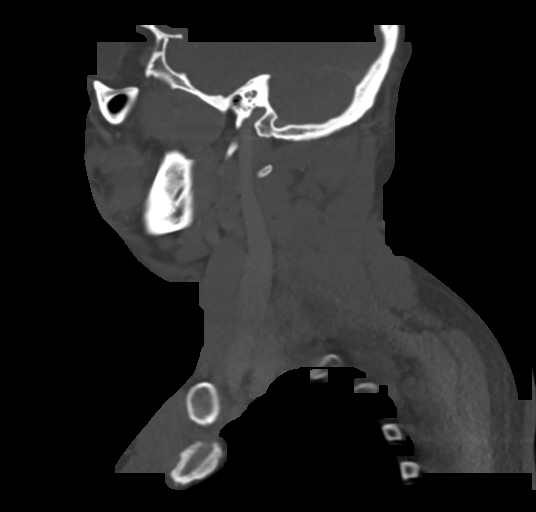
[im 42/101  bone]
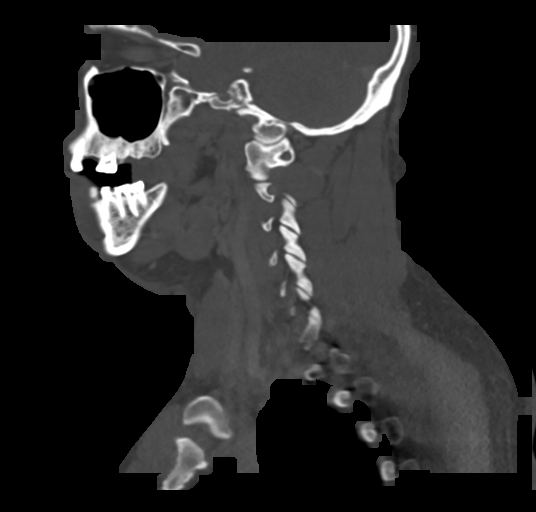
[im 51/101  soft-tissue]
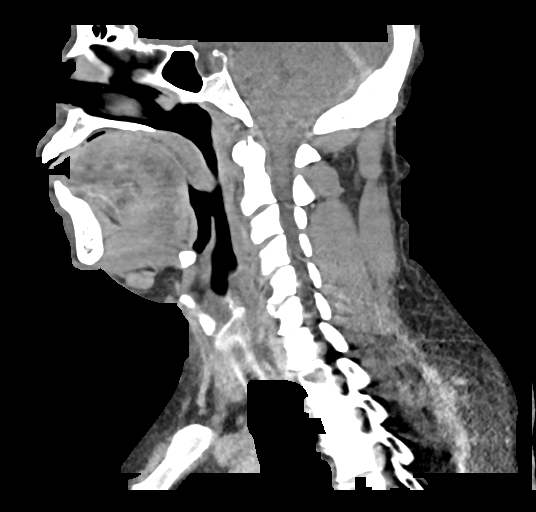
[im 51/101  bone]
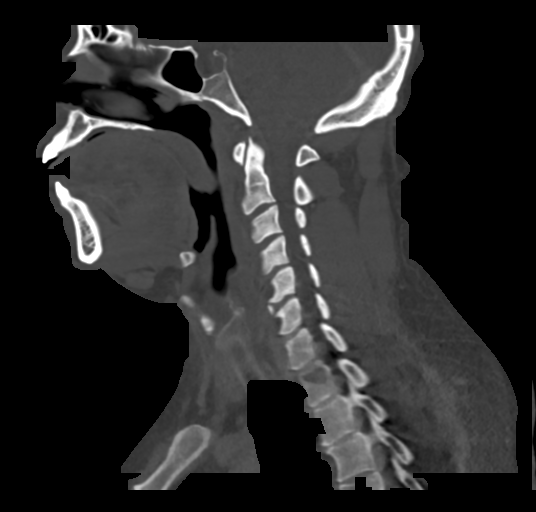
[im 59/101  bone]
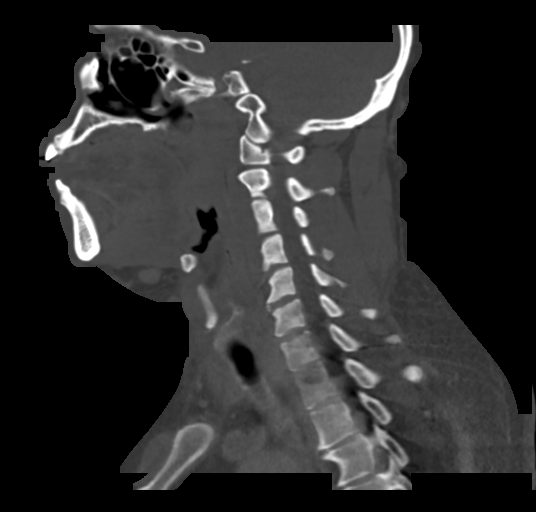
[im 67/101  bone]
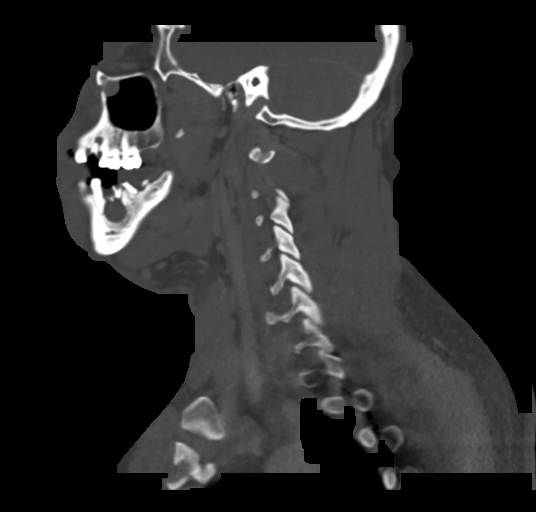

[Series 9: cor neck · coronal · 0.39mm/px · 3 of 125 slices shown]
[im 25/125  bone]
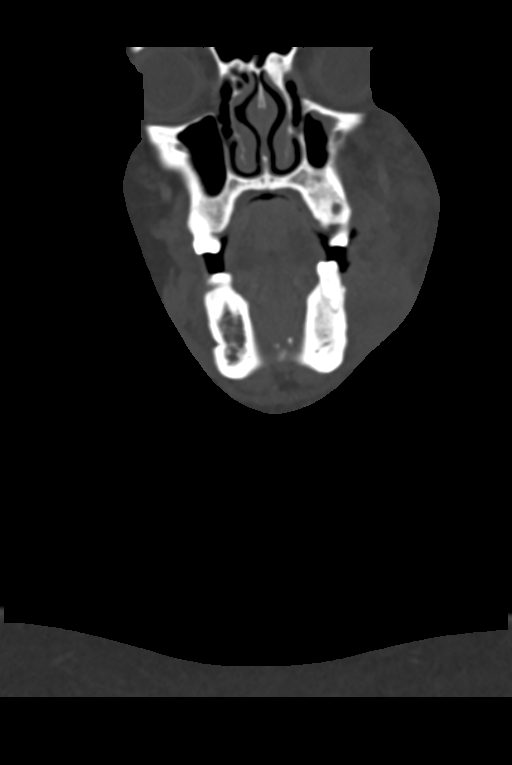
[im 50/125  bone]
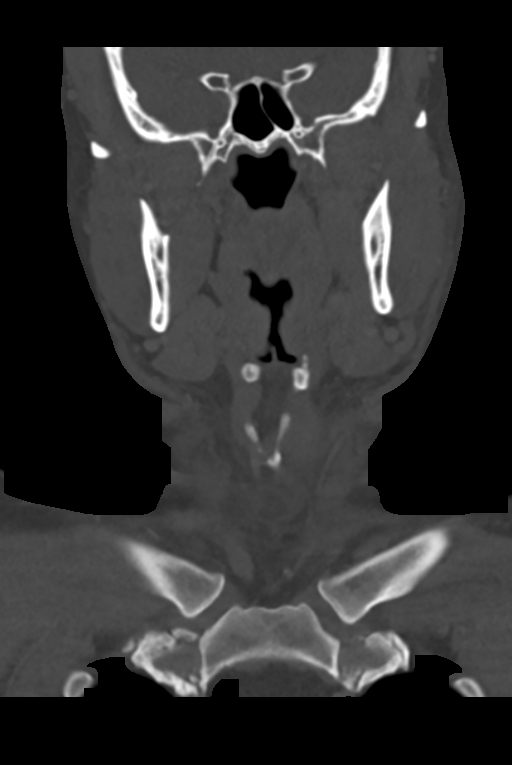
[im 75/125  bone]
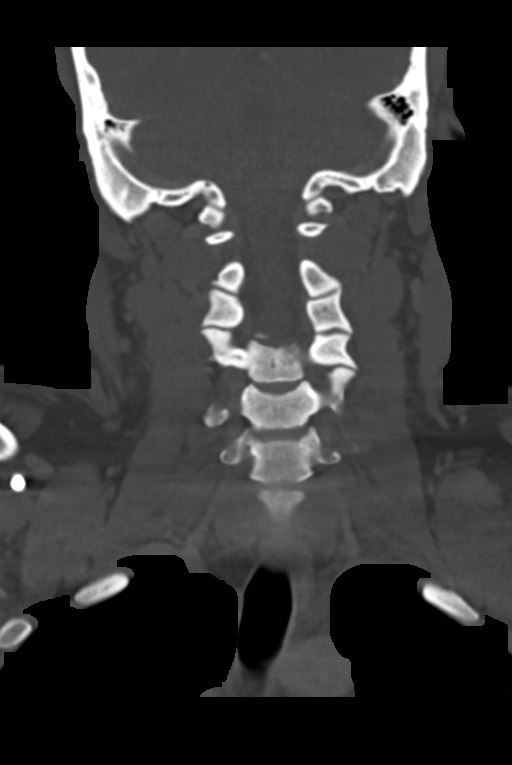

[Series 10: ax oropharynx · axial · 0.39mm/px · z∈[-199,-116]mm · 2 of 127 slices shown]
[im 43/127  bone]
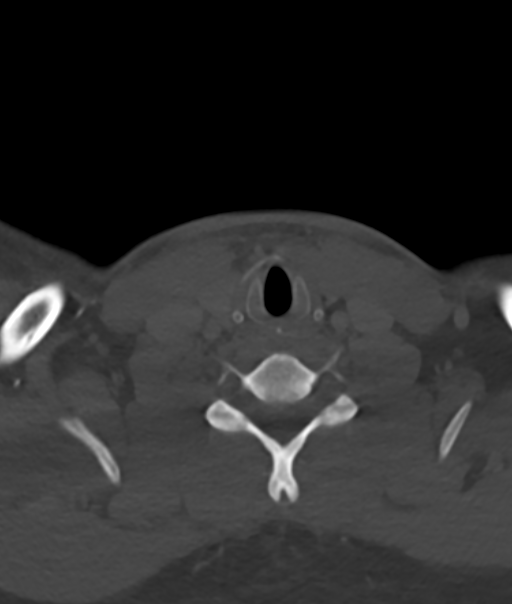
[im 85/127  bone]
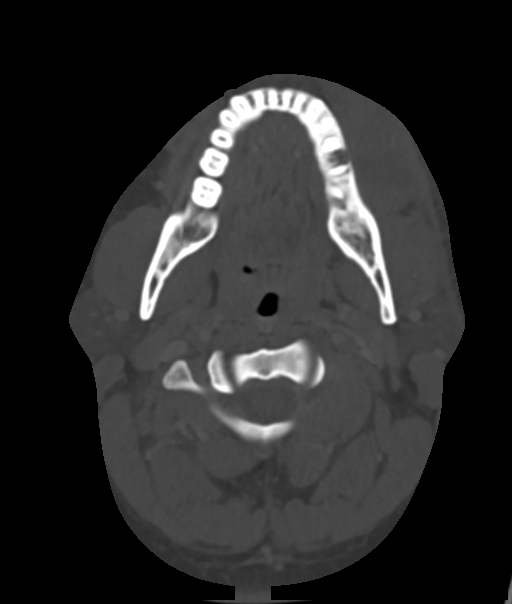

[12 of 33 positions shown; findings below may reference images not displayed]

FINDINGS: Pharynx and larynx: Oral cavity within normal limits. Poor dentition
with innumerable dental caries and periapical lucencies noted.
Prominent 13 mm periapical lucency/abscess present at the left
mandibular body near the base of the left second bicuspid/first
molar (series 7, image 51). Dehiscence of the overlying alveolar
ridge. Extensive inflammatory stranding with swelling within the
adjacent left masticator space, consistent with associated
cellulitis. Extension into the pre maxillary soft tissues as well as
the left parotid space. Further extension into the submental region
with associated irregularity and thickening of the platysmas
musculature. Superimposed somewhat ill-defined hypodense collection
measuring 3.2 x 2.4 x 2.2 cm consistent with abscess (series 9,
image 32). No significant extension of inflammatory changes into the
deeper spaces of the neck at this time.

Palatine tonsils within normal limits. Parapharyngeal fat
maintained. Remainder of the oropharynx and nasopharynx within
normal limits. No retropharyngeal collection. Epiglottis normal.
Vallecula clear. Remainder of the hypopharynx and supraglottic
larynx within normal limits. True cords symmetric and normal.
Subglottic airway clear.

Salivary glands: Hazy inflammatory changes surround the left parotid
gland related to the inflammatory process within the left face.
Salivary glands otherwise within normal limits.

Thyroid: Normal.

Lymph nodes: Prominent level 1 a nodes measure up to 12 mm. Left
level 1 B node measures 13 mm. Left level 2 node measures 16 mm.
Findings presumably reactive. No other adenopathy within the neck.

Vascular: Normal intravascular enhancement seen throughout the neck.

Limited intracranial: Unremarkable.

Visualized orbits: Visualized globes and orbital soft tissues within
normal limits. No postseptal or intraorbital cellulitis.

Mastoids and visualized paranasal sinuses: Few scattered bilateral
maxillary sinus retention cyst. Paranasal sinuses are otherwise
largely clear. Mastoid air cells and middle ear cavities are well
pneumatized and free of fluid.

Skeleton: No acute osseous abnormality. No discrete lytic or blastic
osseous lesions.

Upper chest: Visualized upper chest demonstrates no acute finding.
Partially visualized lungs are clear.

Other: None.
IMPRESSION: 1. Prominent 13 mm periapical lucency/abscess at the left mandibular
body as above. Associated extensive inflammatory stranding with
swelling within the adjacent left face, compatible with associated
cellulitis. Superimposed 3.2 x 2.4 x 2.2 cm odontogenic abscess as
above.
2. Enlarged left-sided cervical adenopathy as above, likely
reactive.

## 2021-06-18 IMAGING — CR DG CHEST 2V
2 series · 2 of 2 positions shown · non-contrast
Comparison: None.

CLINICAL DATA: Positive TB test

EXAM:
CHEST - 2 VIEW

[chest pa]
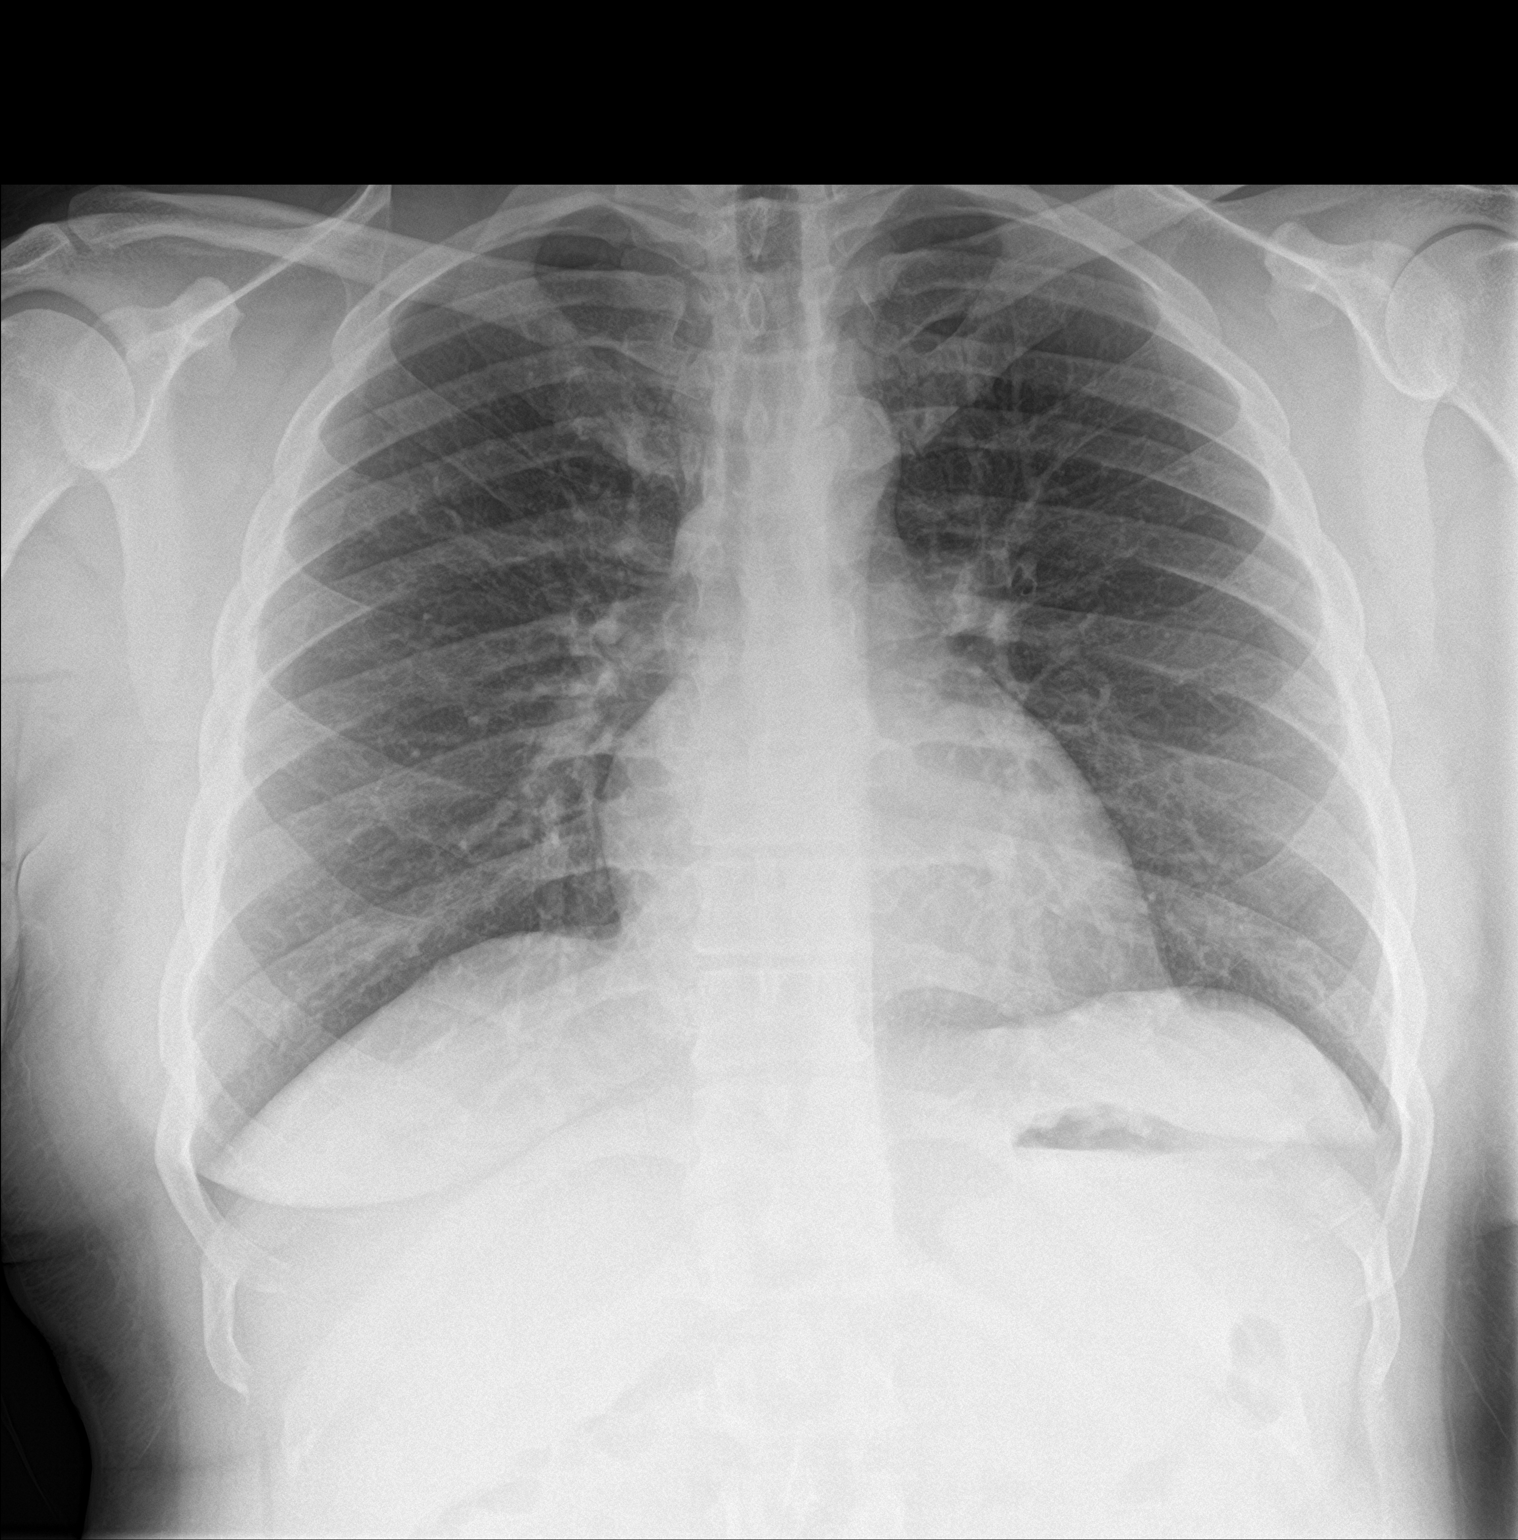

[chest lat]
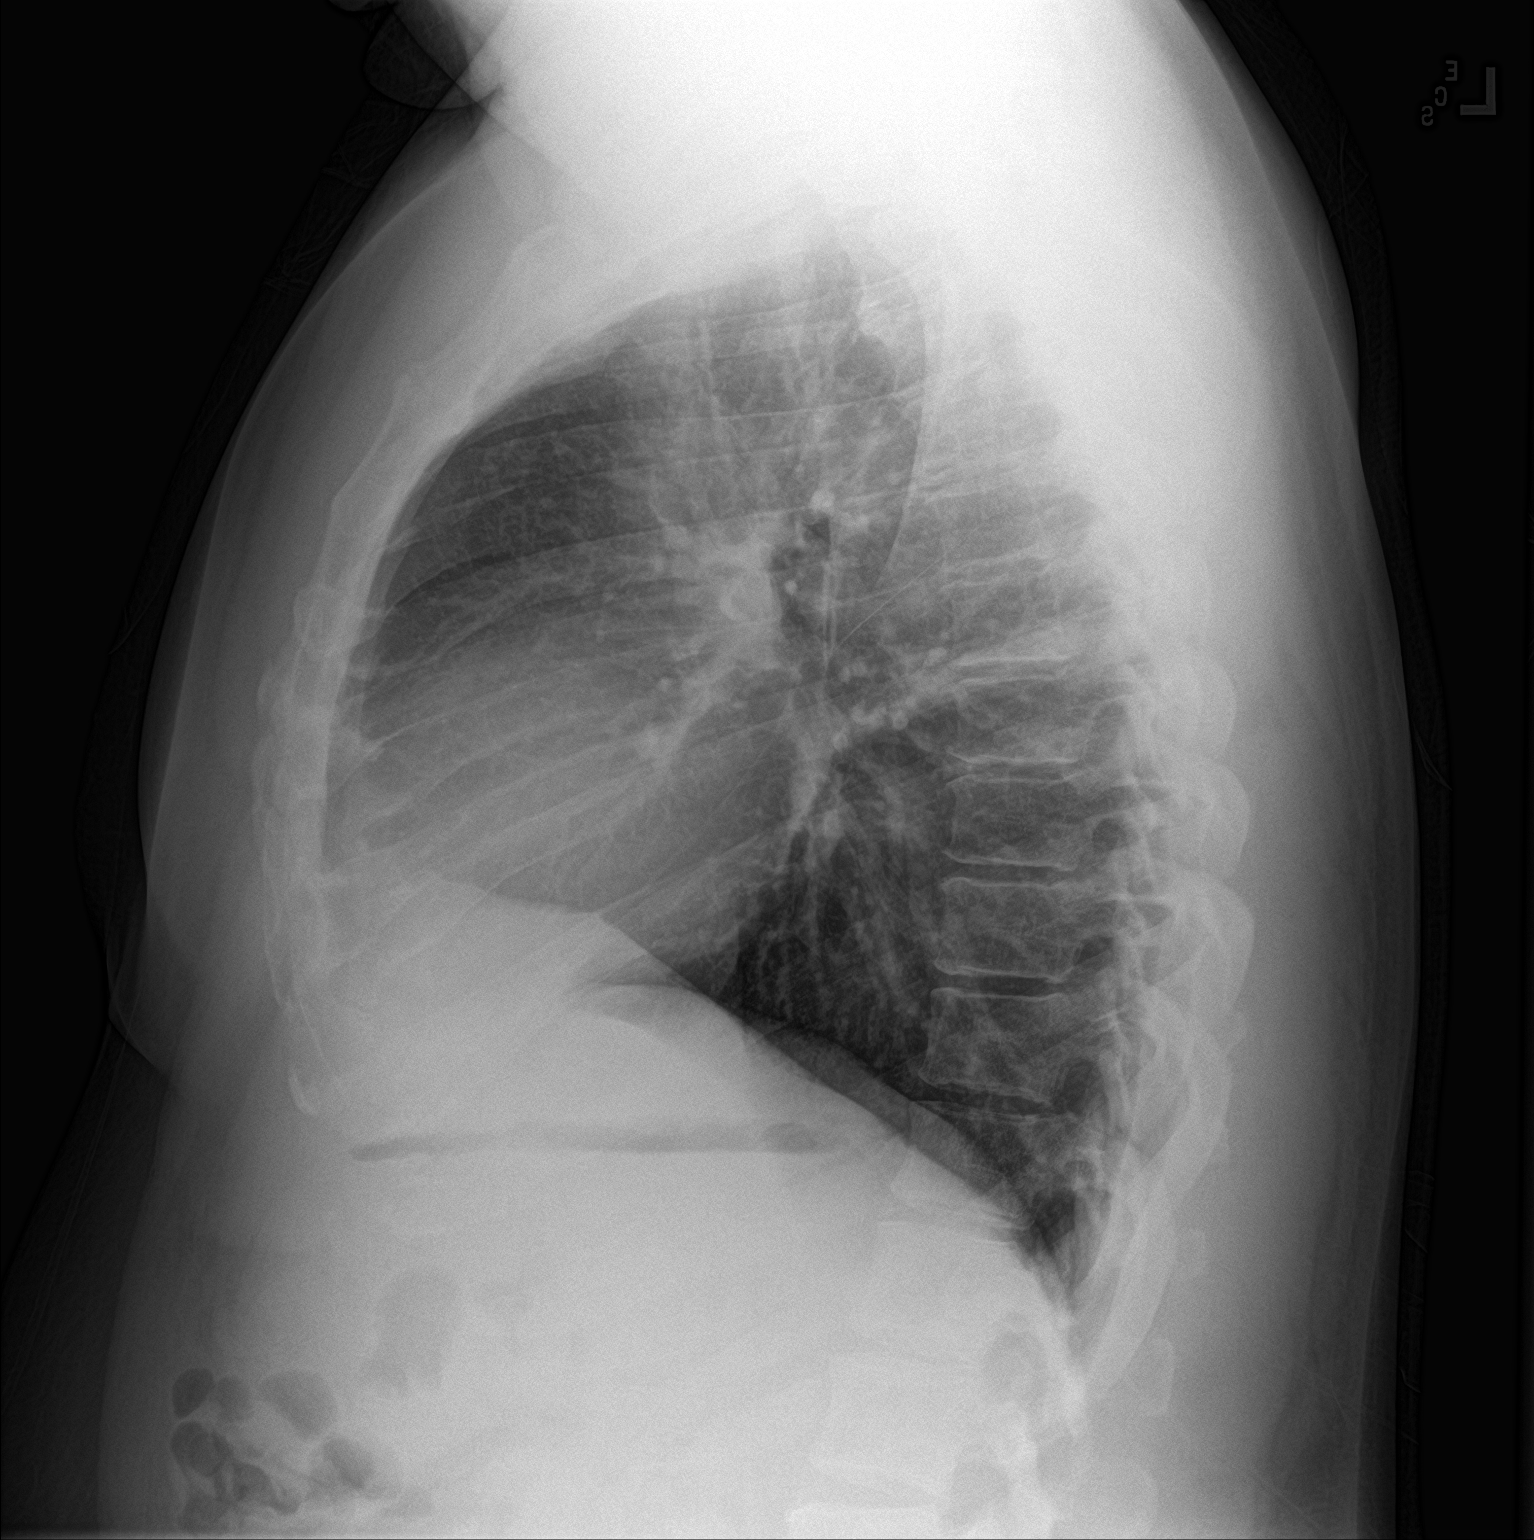

[2 of 2 positions shown; findings below may reference images not displayed]

FINDINGS: Lungs are clear.  No pleural effusion or pneumothorax.

The heart is normal in size.

Visualized osseous structures are within normal limits.
IMPRESSION: Normal chest radiographs. Specifically, no findings to suggest
active TB.

## 2023-02-25 ENCOUNTER — Encounter (HOSPITAL_COMMUNITY): Payer: Self-pay

## 2023-02-25 ENCOUNTER — Other Ambulatory Visit: Payer: Self-pay

## 2023-02-25 ENCOUNTER — Emergency Department (HOSPITAL_COMMUNITY)
Admission: EM | Admit: 2023-02-25 | Discharge: 2023-02-25 | Discharge status: Against Medical Advice | Payer: Self-pay | Attending: Emergency Medicine | Admitting: Emergency Medicine

## 2023-02-25 DIAGNOSIS — Z5321 Procedure and treatment not carried out due to patient leaving prior to being seen by health care provider: Secondary | ICD-10-CM | POA: Insufficient documentation

## 2023-02-25 DIAGNOSIS — R1032 Left lower quadrant pain: Secondary | ICD-10-CM | POA: Insufficient documentation

## 2023-02-25 LAB — CBC
HCT: 39.4 % (ref 39.0–52.0)
Hemoglobin: 13.2 g/dL (ref 13.0–17.0)
MCH: 30.8 pg (ref 26.0–34.0)
MCHC: 33.5 g/dL (ref 30.0–36.0)
MCV: 92.1 fL (ref 80.0–100.0)
Platelets: 232 10*3/uL (ref 150–400)
RBC: 4.28 MIL/uL (ref 4.22–5.81)
RDW: 15.1 % (ref 11.5–15.5)
WBC: 5.5 10*3/uL (ref 4.0–10.5)
nRBC: 0 % (ref 0.0–0.2)

## 2023-02-25 LAB — BASIC METABOLIC PANEL
Anion gap: 11 (ref 5–15)
BUN: 12 mg/dL (ref 6–20)
CO2: 21 mmol/L — ABNORMAL LOW (ref 22–32)
Calcium: 9.1 mg/dL (ref 8.9–10.3)
Chloride: 105 mmol/L (ref 98–111)
Creatinine, Ser: 0.86 mg/dL (ref 0.61–1.24)
GFR, Estimated: >60 mL/min (ref >60)
Glucose, Bld: 124 mg/dL — ABNORMAL HIGH (ref 70–99)
Potassium: 3.6 mmol/L (ref 3.5–5.1)
Sodium: 137 mmol/L (ref 135–145)

## 2023-02-25 NOTE — ED Notes (Signed)
Called pt again for vital, no reponse

## 2023-02-25 NOTE — ED Notes (Signed)
Pt called no response for vitals. Called twice

## 2023-02-25 NOTE — ED Triage Notes (Signed)
Pt c/o left flank painx3d. Pt denies N/V, urinary issues.

## 2023-03-01 ENCOUNTER — Other Ambulatory Visit: Payer: Self-pay

## 2023-03-01 ENCOUNTER — Emergency Department (HOSPITAL_COMMUNITY): Payer: Self-pay

## 2023-03-01 ENCOUNTER — Emergency Department (HOSPITAL_COMMUNITY)
Admission: EM | Admit: 2023-03-01 | Discharge: 2023-03-01 | Disposition: A | Discharge status: Home / Self Care | Payer: Self-pay | Attending: Emergency Medicine | Admitting: Emergency Medicine

## 2023-03-01 ENCOUNTER — Encounter (HOSPITAL_COMMUNITY): Payer: Self-pay | Admitting: Emergency Medicine

## 2023-03-01 DIAGNOSIS — M79622 Pain in left upper arm: Secondary | ICD-10-CM | POA: Insufficient documentation

## 2023-03-01 DIAGNOSIS — R109 Unspecified abdominal pain: Secondary | ICD-10-CM | POA: Insufficient documentation

## 2023-03-01 DIAGNOSIS — R0789 Other chest pain: Secondary | ICD-10-CM | POA: Insufficient documentation

## 2023-03-01 LAB — URINALYSIS, ROUTINE W REFLEX MICROSCOPIC
Bacteria, UA: NONE SEEN
Bilirubin Urine: NEGATIVE
Glucose, UA: NEGATIVE mg/dL
Ketones, ur: NEGATIVE mg/dL
Leukocytes,Ua: NEGATIVE
Nitrite: NEGATIVE
Protein, ur: NEGATIVE mg/dL
Specific Gravity, Urine: 1.028 (ref 1.005–1.030)
pH: 5 (ref 5.0–8.0)

## 2023-03-01 MED ORDER — SALONPAS 3.1-6-10 % EX PTCH: 1.0000 | MEDICATED_PATCH | Freq: Two times a day (BID) | CUTANEOUS | 0 refills | Status: AC

## 2023-03-01 MED ORDER — KETOROLAC TROMETHAMINE 15 MG/ML IJ SOLN
15.0000 mg | Freq: Once | INTRAMUSCULAR | Status: AC
  Administered 2023-03-01: 15 mg via INTRAMUSCULAR
  Filled 2023-03-01: qty 1

## 2023-03-01 MED ORDER — PREDNISONE 20 MG PO TABS: 40.0000 mg | ORAL_TABLET | Freq: Every day | ORAL | 0 refills | Status: AC

## 2023-03-01 NOTE — ED Triage Notes (Signed)
Patient arrives ambulatory by POV c/o left sided pain x 1 week. States he came the other day and left without being seen due to wait time. States pain has been consistent. Denies any injury or any urinary symptoms. Patient states pain has been intermittent over the past 5 years but it has never been this consistent.

## 2023-03-01 NOTE — ED Provider Notes (Signed)
[place] EMERGENCY DEPARTMENT AT [facility] Provider Note   CSN: [id number] Arrival date & time: 03/01/23  0740     History  Chief Complaint  Patient presents with   Flank Pain    Edwin Palmer is a 42 y.o. male.  HPI Patient presents with pain in the left inferior anterior chest.  He notes initially the pain has been there for several days, but then states that has been there for years, intermittently, but now persistently over the past week.  Pain is focal, worse with motion, not necessarily improved with anything.  No cough, no fever, no other chest pain, no other abdominal pain.  Patient does smoke cigarettes.    Home Medications Prior to Admission medications   Medication Sig Start Date End Date Taking? Authorizing Provider  Camphor-Menthol-Methyl Sal (SALONPAS) 3.06-26-08 % PTCH Apply 1 patch topically 2 (two) times daily. 03/01/23  Yes Gerhard Munch, MD  predniSONE (DELTASONE) 20 MG tablet Take 2 tablets (40 mg total) by mouth daily with breakfast. For the next four days 03/01/23  Yes Gerhard Munch, MD  amoxicillin-clavulanate (AUGMENTIN) 875-125 MG tablet Take 1 tablet by mouth 2 (two) times daily. 12/17/18   Leatha Gilding, MD      Allergies    Patient has no known allergies.    Review of Systems   Review of Systems  All other systems reviewed and are negative.   Physical Exam Updated Vital Signs BP 114/71 (BP Location: Right Arm)   Pulse 67   Temp 98.6 F (37 C) (Oral)   Resp 18   Ht 5\' 7"  (1.702 m)   Wt 106.1 kg   SpO2 99%   BMI 36.65 kg/m  Physical Exam Vitals and nursing note reviewed.  Constitutional:      General: He is not in acute distress.    Appearance: He is well-developed.  HENT:     Head: Normocephalic and atraumatic.  Eyes:     Conjunctiva/sclera: Conjunctivae normal.  Cardiovascular:     Rate and Rhythm: Normal rate and regular rhythm.  Pulmonary:     Effort: Pulmonary effort is normal. No respiratory distress.      Breath sounds: No stridor.  Abdominal:     General: There is no distension.  Skin:    General: Skin is warm and dry.  Neurological:     Mental Status: He is alert and oriented to person, place, and time.     ED Results / Procedures / Treatments   Labs (all labs ordered are listed, but only abnormal results are displayed) Labs Reviewed  URINALYSIS, ROUTINE W REFLEX MICROSCOPIC - Abnormal; Notable for the following components:      Result Value   Hgb urine dipstick SMALL (*)    All other components within normal limits    EKG None  Radiology CT Renal Stone Study  Result Date: 03/01/2023 CLINICAL DATA:  Left flank pain and hematuria. EXAM: CT ABDOMEN AND PELVIS WITHOUT CONTRAST TECHNIQUE: Multidetector CT imaging of the abdomen and pelvis was performed following the standard protocol without IV contrast. RADIATION DOSE REDUCTION: This exam was performed according to the departmental dose-optimization program which includes automated exposure control, adjustment of the mA and/or kV according to patient size and/or use of iterative reconstruction technique. COMPARISON:  None Available. FINDINGS: Lower chest: Unremarkable. Hepatobiliary: The liver shows diffusely decreased attenuation suggesting fat deposition. No suspicious focal abnormality in the liver on this study without intravenous contrast. There is no evidence for gallstones, gallbladder wall  thickening, or pericholecystic fluid. No intrahepatic or extrahepatic biliary dilation. Pancreas: No focal mass lesion. No dilatation of the main duct. No intraparenchymal cyst. No peripancreatic edema. Spleen: No splenomegaly. No suspicious focal mass lesion. Adrenals/Urinary Tract: No adrenal nodule or mass. Left kidney and ureter unremarkable. Right kidney and ureter unremarkable. The urinary bladder appears normal for the degree of distention. Stomach/Bowel: Stomach is unremarkable. No gastric wall thickening. No evidence of outlet obstruction.  Duodenum is normally positioned as is the ligament of Treitz. No small bowel wall thickening. No small bowel dilatation. The terminal ileum is normal. The appendix is normal. No gross colonic mass. No colonic wall thickening. Vascular/Lymphatic: No abdominal aortic aneurysm. No abdominal aortic atherosclerotic calcification. There is no gastrohepatic or hepatoduodenal ligament lymphadenopathy. No retroperitoneal or mesenteric lymphadenopathy. No pelvic sidewall lymphadenopathy. Reproductive: The prostate gland and seminal vesicles are unremarkable. Other: No intraperitoneal free fluid. Musculoskeletal: No worrisome lytic or sclerotic osseous abnormality. IMPRESSION: 1. No acute findings in the abdomen or pelvis. Specifically, no findings to explain the patient's history of left flank pain and hematuria. 2. Hepatic steatosis. Electronically Signed   By: Kennith Center M.D.   On: 03/01/2023 11:05   DG Ribs Unilateral W/Chest Left  Result Date: 03/01/2023 CLINICAL DATA:  Left inferior anterior rib pain.  Smoker. EXAM: LEFT RIBS AND CHEST - 3+ VIEW COMPARISON:  None Available. FINDINGS: Three views of the chest and left ribs. No fracture or other bone lesions are seen involving the ribs. There is no evidence of pneumothorax or pleural effusion. Both lungs are clear. Heart size and mediastinal contours are within normal limits. IMPRESSION: Negative left rib radiographs. Electronically Signed   By: Orvan Falconer M.D.   On: 03/01/2023 09:21    Procedures Procedures    Medications Ordered in ED Medications  ketorolac (TORADOL) 15 MG/ML injection 15 mg (15 mg Intramuscular Given 03/01/23 0947)    ED Course/ Medical Decision Making/ A&P                                 Medical Decision Making Adult male, smoker presents with chest pain.  Chronicity suggests musculoskeletal etiology, but given smoking history, age, pulmonary or intrathoracic lesions considered. Less likely renal given the denial of urinary  complaints.  Amount and/or Complexity of Data Reviewed Labs: ordered. Decision-making details documented in ED Course. Radiology: ordered and independent interpretation performed. Decision-making details documented in ED Course.  Risk Prescription drug management.   11:22 AM Patient awake, alert, and distress.  After initial tests were reviewed, notable for hematuria, CT scan ordered given his left axillary pain.  CT reviewed, unremarkable for intra-abdominal pathology.  He and I discussed possibilities including soft tissue injury, pain, less likely shingles with atypical presentation given the episodes of pain going back quite some time.  Patient will start anti-inflammatories will follow-up with primary care.        Final Clinical Impression(s) / ED Diagnoses Final diagnoses:  Pain in left axilla    Rx / DC Orders ED Discharge Orders          Ordered    predniSONE (DELTASONE) 20 MG tablet  Daily with breakfast        03/01/23 1122    Camphor-Menthol-Methyl Sal (SALONPAS) 3.06-26-08 % PTCH  2 times daily        03/01/23 1122              Gerhard Munch, MD  03/01/23 1123  

## 2023-03-01 NOTE — Discharge Instructions (Signed)
As discussed, your evaluation today has been largely reassuring.  But, it is important that you monitor your condition carefully, and do not hesitate to return to the ED if you develop new, or concerning changes in your condition. ? ?Otherwise, please follow-up with your physician for appropriate ongoing care. ? ?

## 2023-03-01 NOTE — ED Notes (Signed)
Patient transported to CT 

## 2023-03-01 NOTE — ED Notes (Signed)
Patient transported to X-ray 

## 2023-07-12 ENCOUNTER — Emergency Department (HOSPITAL_COMMUNITY)
Admission: EM | Admit: 2023-07-12 | Discharge: 2023-07-12 | Disposition: A | Discharge status: Home / Self Care | Payer: Self-pay | Attending: Emergency Medicine | Admitting: Emergency Medicine

## 2023-07-12 ENCOUNTER — Emergency Department (HOSPITAL_COMMUNITY): Payer: Self-pay

## 2023-07-12 ENCOUNTER — Encounter (HOSPITAL_COMMUNITY): Payer: Self-pay

## 2023-07-12 DIAGNOSIS — R0781 Pleurodynia: Secondary | ICD-10-CM | POA: Insufficient documentation

## 2023-07-12 DIAGNOSIS — X501XXA Overexertion from prolonged static or awkward postures, initial encounter: Secondary | ICD-10-CM | POA: Insufficient documentation

## 2023-07-12 DIAGNOSIS — R0602 Shortness of breath: Secondary | ICD-10-CM | POA: Insufficient documentation

## 2023-07-12 DIAGNOSIS — Y990 Civilian activity done for income or pay: Secondary | ICD-10-CM | POA: Insufficient documentation

## 2023-07-12 DIAGNOSIS — R109 Unspecified abdominal pain: Secondary | ICD-10-CM | POA: Insufficient documentation

## 2023-07-12 LAB — CBC WITH DIFFERENTIAL/PLATELET
Abs Immature Granulocytes: 0.02 10*3/uL (ref 0.00–0.07)
Basophils Absolute: 0.1 10*3/uL (ref 0.0–0.1)
Basophils Relative: 1 %
Eosinophils Absolute: 0.1 10*3/uL (ref 0.0–0.5)
Eosinophils Relative: 2 %
HCT: 40.4 % (ref 39.0–52.0)
Hemoglobin: 13.3 g/dL (ref 13.0–17.0)
Immature Granulocytes: 0 %
Lymphocytes Relative: 43 %
Lymphs Abs: 2.4 10*3/uL (ref 0.7–4.0)
MCH: 30.3 pg (ref 26.0–34.0)
MCHC: 32.9 g/dL (ref 30.0–36.0)
MCV: 92 fL (ref 80.0–100.0)
Monocytes Absolute: 0.5 10*3/uL (ref 0.1–1.0)
Monocytes Relative: 9 %
Neutro Abs: 2.5 10*3/uL (ref 1.7–7.7)
Neutrophils Relative %: 45 %
Platelets: 216 10*3/uL (ref 150–400)
RBC: 4.39 MIL/uL (ref 4.22–5.81)
RDW: 14.8 % (ref 11.5–15.5)
WBC: 5.5 10*3/uL (ref 4.0–10.5)
nRBC: 0 % (ref 0.0–0.2)

## 2023-07-12 LAB — COMPREHENSIVE METABOLIC PANEL
ALT: 42 U/L (ref 0–44)
AST: 20 U/L (ref 15–41)
Albumin: 3.7 g/dL (ref 3.5–5.0)
Alkaline Phosphatase: 71 U/L (ref 38–126)
Anion gap: 11 (ref 5–15)
BUN: 5 mg/dL — ABNORMAL LOW (ref 6–20)
CO2: 23 mmol/L (ref 22–32)
Calcium: 8.9 mg/dL (ref 8.9–10.3)
Chloride: 105 mmol/L (ref 98–111)
Creatinine, Ser: 0.72 mg/dL (ref 0.61–1.24)
GFR, Estimated: >60 mL/min (ref >60)
Glucose, Bld: 108 mg/dL — ABNORMAL HIGH (ref 70–99)
Potassium: 3.7 mmol/L (ref 3.5–5.1)
Sodium: 139 mmol/L (ref 135–145)
Total Bilirubin: 0.5 mg/dL (ref 0.0–1.2)
Total Protein: 6.3 g/dL — ABNORMAL LOW (ref 6.5–8.1)

## 2023-07-12 LAB — URINALYSIS, W/ REFLEX TO CULTURE (INFECTION SUSPECTED)
Bacteria, UA: NONE SEEN
Bilirubin Urine: NEGATIVE
Glucose, UA: NEGATIVE mg/dL
Hgb urine dipstick: NEGATIVE
Ketones, ur: NEGATIVE mg/dL
Leukocytes,Ua: NEGATIVE
Nitrite: NEGATIVE
Protein, ur: NEGATIVE mg/dL
Specific Gravity, Urine: 1.023 (ref 1.005–1.030)
pH: 5 (ref 5.0–8.0)

## 2023-07-12 LAB — TROPONIN I (HIGH SENSITIVITY): Troponin I (High Sensitivity): 4 ng/L (ref <18)

## 2023-07-12 MED ORDER — MORPHINE SULFATE (PF) 4 MG/ML IV SOLN
4.0000 mg | Freq: Once | INTRAVENOUS | Status: AC
  Administered 2023-07-12: 4 mg via INTRAVENOUS
  Filled 2023-07-12: qty 1

## 2023-07-12 MED ORDER — ACETAMINOPHEN 500 MG PO TABS
1000.0000 mg | ORAL_TABLET | Freq: Once | ORAL | Status: AC
  Administered 2023-07-12: 1000 mg via ORAL
  Filled 2023-07-12: qty 2

## 2023-07-12 MED ORDER — NAPROXEN 500 MG PO TABS
500.0000 mg | ORAL_TABLET | Freq: Two times a day (BID) | ORAL | 0 refills | Status: AC
Start: 2023-07-12 — End: ?

## 2023-07-12 MED ORDER — ONDANSETRON HCL 4 MG/2ML IJ SOLN
4.0000 mg | Freq: Once | INTRAMUSCULAR | Status: AC
  Administered 2023-07-12: 4 mg via INTRAVENOUS
  Filled 2023-07-12: qty 2

## 2023-07-12 MED ORDER — LIDOCAINE 5 % EX PTCH
1.0000 | MEDICATED_PATCH | CUTANEOUS | Status: DC
  Administered 2023-07-12: 1 via TRANSDERMAL
  Filled 2023-07-12: qty 1

## 2023-07-12 MED ORDER — METHOCARBAMOL 500 MG PO TABS
500.0000 mg | ORAL_TABLET | Freq: Two times a day (BID) | ORAL | 0 refills | Status: AC
Start: 2023-07-12 — End: ?

## 2023-07-12 MED ORDER — LIDOCAINE 5 % EX PTCH: 1.0000 | MEDICATED_PATCH | CUTANEOUS | 0 refills | Status: AC

## 2023-07-12 NOTE — ED Triage Notes (Signed)
Pt is coming in for left side pain, this is something he experienced a couple months ago and had X-rays done. Does not remember the Dx. States he was on his phone and turned this morning causing him to have a pain in his left flank/lower ribcage area.

## 2023-07-12 NOTE — Discharge Instructions (Signed)
I have written you for a few medications.  Take as prescribed.  Make sure to follow-up outpatient, return for any worsening symptoms

## 2023-07-12 NOTE — ED Provider Notes (Signed)
[place] EMERGENCY DEPARTMENT AT [facility] Provider Note   CSN: [id number] Arrival date & time: 07/12/23  1557     History  Chief Complaint  Patient presents with   Flank Pain    Edwin Palmer is a 43 y.o. male for evaluation of of left lower rib/ flank pain.  He has had intermittent pain over the last year however pain worsened today.  Unsure clear cause of worsening however stated he did move incorrectly at work and twisted and felt the pain worsen. No recent falls.  Has occasional shortness of breath chronically no change. No pain or swelling to extremities. No rashes or lesions. Pain not exertional or pleuritic in nature. Had something previously and seen at that time. No fever, SOB, hemoptysis, diarrhea, arm pain, emesis, diarrhea. No dysuria, hematuria. No hx of kidney stones. No hx of AAA, dissection.  HPI     Home Medications Prior to Admission medications   Medication Sig Start Date End Date Taking? Authorizing Provider  lidocaine (LIDODERM) 5 % Place 1 patch onto the skin daily. Remove & Discard patch within 12 hours or as directed by MD 07/12/23  Yes Keirra Zeimet A, PA-C  methocarbamol (ROBAXIN) 500 MG tablet Take 1 tablet (500 mg total) by mouth 2 (two) times daily. 07/12/23  Yes Avis Tirone A, PA-C  naproxen (NAPROSYN) 500 MG tablet Take 1 tablet (500 mg total) by mouth 2 (two) times daily. 07/12/23  Yes Kemuel Buchmann A, PA-C  amoxicillin-clavulanate (AUGMENTIN) 875-125 MG tablet Take 1 tablet by mouth 2 (two) times daily. 12/17/18   Leatha Gilding, MD  Camphor-Menthol-Methyl Sal (SALONPAS) 3.06-26-08 % PTCH Apply 1 patch topically 2 (two) times daily. 03/01/23   Gerhard Munch, MD  predniSONE (DELTASONE) 20 MG tablet Take 2 tablets (40 mg total) by mouth daily with breakfast. For the next four days 03/01/23   Gerhard Munch, MD      Allergies    Patient has no known allergies.    Review of Systems   Review of Systems  Constitutional:  Negative.   HENT: Negative.    Respiratory: Negative.    Gastrointestinal: Negative.   Genitourinary:  Positive for flank pain. Negative for decreased urine volume, difficulty urinating, dysuria, enuresis, frequency, genital sores, hematuria, penile discharge, penile pain, penile swelling, scrotal swelling, testicular pain and urgency.  Skin: Negative.   Neurological: Negative.   All other systems reviewed and are negative.   Physical Exam Updated Vital Signs BP 133/78 (BP Location: Right Arm)   Pulse 77   Temp 98.6 F (37 C) (Oral)   Resp 20   SpO2 100%  Physical Exam Vitals and nursing note reviewed.  Constitutional:      General: He is not in acute distress.    Appearance: He is well-developed. He is not ill-appearing, toxic-appearing or diaphoretic.  HENT:     Head: Normocephalic and atraumatic.     Mouth/Throat:     Mouth: Mucous membranes are moist.  Eyes:     Pupils: Pupils are equal, round, and reactive to light.  Cardiovascular:     Rate and Rhythm: Normal rate and regular rhythm.     Pulses: Normal pulses.          Radial pulses are 2+ on the right side and 2+ on the left side.       Dorsalis pedis pulses are 2+ on the right side and 2+ on the left side.     Heart sounds: Normal heart sounds.  Pulmonary:     Effort: Pulmonary effort is normal. No respiratory distress.     Breath sounds: Normal breath sounds.  Chest:    Abdominal:     General: Bowel sounds are normal. There is no distension.     Palpations: Abdomen is soft.  Musculoskeletal:        General: Normal range of motion.     Cervical back: Normal range of motion and neck supple.     Comments: Tenderness to left lateral chest wall. No crepitus step off. No midline tenderness to C/T/L.  Tender bilateral upper and lower extremities.  Skin:    General: Skin is warm and dry.     Capillary Refill: Capillary refill takes less than 2 seconds.     Comments: No obvious rashes or lesions  Neurological:      General: No focal deficit present.     Mental Status: He is alert and oriented to person, place, and time.     ED Results / Procedures / Treatments   Labs (all labs ordered are listed, but only abnormal results are displayed) Labs Reviewed  COMPREHENSIVE METABOLIC PANEL - Abnormal; Notable for the following components:      Result Value   Glucose, Bld 108 (*)    BUN 5 (*)    Total Protein 6.3 (*)    All other components within normal limits  URINALYSIS, W/ REFLEX TO CULTURE (INFECTION SUSPECTED) - Abnormal; Notable for the following components:   APPearance HAZY (*)    All other components within normal limits  CBC WITH DIFFERENTIAL/PLATELET  TROPONIN I (HIGH SENSITIVITY)    EKG EKG Interpretation Date/Time:  Tuesday July 12 2023 23:11:32 EST Ventricular Rate:  69 PR Interval:  136 QRS Duration:  94 QT Interval:  408 QTC Calculation: 437 R Axis:   57  Text Interpretation: Normal sinus rhythm Normal ECG No previous ECGs available Confirmed by Gilda Crease [phone]) on 07/12/2023 11:14:28 PM  Radiology DG Ribs Unilateral W/Chest Left Result Date: 07/12/2023 CLINICAL DATA:  Left-sided pain EXAM: LEFT RIBS AND CHEST - 3+ VIEW COMPARISON:  03/01/2023 FINDINGS: No fracture or other bone lesions are seen involving the ribs. There is no evidence of pneumothorax or pleural effusion. Both lungs are clear. Heart size and mediastinal contours are within normal limits. IMPRESSION: Negative. Electronically Signed   By: Corlis Leak M.D.   On: 07/12/2023 16:53    Procedures Procedures    Medications Ordered in ED Medications  lidocaine (LIDODERM) 5 % 1 patch (1 patch Transdermal Patch Applied 07/12/23 1934)  acetaminophen (TYLENOL) tablet 1,000 mg (1,000 mg Oral Given 07/12/23 1934)  morphine (PF) 4 MG/ML injection 4 mg (4 mg Intravenous Given 07/12/23 2045)  ondansetron (ZOFRAN) injection 4 mg (4 mg Intravenous Given 07/12/23 2045)    ED Course/ Medical Decision Making/ A&P    Patient here with left lateral flank pain.  Has had pain over the last year, waxes and wanes.  Returned today and had worsening pain after turning at work.  Pain is not exertional, nonpleuritic in nature.  Pain reproducible on exam.  Has no skin changes to suggest shingles.  He has no urinary complaints.  Negative CVA tap.  He is neurovascularly intact.  Does not appear grossly fluid overloaded.  No pain or swelling to legs that was patient for PE or DVT.  No midline back pain, midline abdominal pain low suspicion for AAA, dissection.  Plan on labs, imaging and reassess  Labs and imaging personally viewed  and interpreted:  CBC without leukocytosis UA negative for infection Metabolic panel without significant abnormality Troponin negative EKG without ischemic changes Chest x-ray without significant abnormality  Patient reassessed, pain controlled.  Symptoms do not seem consistent with acute ACS, PE, dissection, pneumothorax, fluid overload, unstable angina, AAA, dissection, bowel perforation, obstruction, diverticulitis, shingles.  Given pain is reproducible on exam I suspect this is likely musculoskeletal in etiology.  The patient has been appropriately medically screened and/or stabilized in the ED. I have low suspicion for any other emergent medical condition which would require further screening, evaluation or treatment in the ED or require inpatient management.  Patient is hemodynamically stable and in no acute distress.  Patient able to ambulate in department prior to ED.  Evaluation does not show acute pathology that would require ongoing or additional emergent interventions while in the emergency department or further inpatient treatment.  I have discussed the diagnosis with the patient and answered all questions.  Pain is been managed while in the emergency department and patient has no further complaints prior to discharge.  Patient is comfortable with plan discussed in room and is stable  for discharge at this time.  I have discussed strict return precautions for returning to the emergency department.  Patient was encouraged to follow-up with PCP/specialist refer to at discharge.                                  Medical Decision Making Amount and/or Complexity of Data Reviewed External Data Reviewed: labs, radiology, ECG and notes. Labs: ordered. Decision-making details documented in ED Course. Radiology: ordered and independent interpretation performed. Decision-making details documented in ED Course. ECG/medicine tests: ordered and independent interpretation performed. Decision-making details documented in ED Course.  Risk OTC drugs. Prescription drug management. Parenteral controlled substances. Decision regarding hospitalization. Diagnosis or treatment significantly limited by social determinants of health.          Final Clinical Impression(s) / ED Diagnoses Final diagnoses:  Flank pain    Rx / DC Orders ED Discharge Orders          Ordered    methocarbamol (ROBAXIN) 500 MG tablet  2 times daily        07/12/23 2338    naproxen (NAPROSYN) 500 MG tablet  2 times daily        07/12/23 2338    lidocaine (LIDODERM) 5 %  Every 24 hours        07/12/23 2338              Malessa Zartman A, PA-C 07/12/23 2346    Rondel Baton, MD 07/13/23 1336

## 2023-07-12 NOTE — ED Provider Triage Note (Signed)
Emergency Medicine Provider Triage Evaluation Note  Arunas Ishman , a 43 y.o. male  was evaluated in triage.  Pt complains of left rib pain that began over a year ago however today was exacerbated and patient twisted the wrong way.  Patient denies any saddle anesthesia or urinary or bowel incontinence or new onset weakness.  Patient states has had this pain for over a year and has been seen in the past with negative workup.  Patient does not take any medications for this.  Patient states that he is tender in the area.  Patient denies chest pain or shortness of breath.  Review of Systems  Positive:  Negative:   Physical Exam  BP 136/86   Pulse 75   Temp 98.4 F (36.9 C)   Resp 18   SpO2 100%  Gen:   Awake, no distress   Resp:  Normal effort  MSK:   Moves extremities without difficulty  Other:  Tenderness to left lower ribs without bony abnormalities  Medical Decision Making  Medically screening exam initiated at 4:34 PM.  Appropriate orders placed.  Vesta Hendriks was informed that the remainder of the evaluation will be completed by another provider, this initial triage assessment does not replace that evaluation, and the importance of remaining in the ED until their evaluation is complete.  Workup initiated, patient does not have any chest pain or shortness of breath and so do feel this is MSK in nature given chronicity, will give medications and get imaging.  Patient stable at this time.   Netta Corrigan, PA-C 07/12/23 1635

## 2023-12-06 ENCOUNTER — Other Ambulatory Visit: Payer: Self-pay

## 2023-12-06 ENCOUNTER — Emergency Department (HOSPITAL_COMMUNITY)
Admission: EM | Admit: 2023-12-06 | Discharge: 2023-12-06 | Disposition: A | Discharge status: Home / Self Care | Attending: Emergency Medicine | Admitting: Emergency Medicine

## 2023-12-06 ENCOUNTER — Encounter (HOSPITAL_COMMUNITY): Payer: Self-pay

## 2023-12-06 DIAGNOSIS — Z720 Tobacco use: Secondary | ICD-10-CM | POA: Diagnosis not present

## 2023-12-06 DIAGNOSIS — K529 Noninfective gastroenteritis and colitis, unspecified: Secondary | ICD-10-CM | POA: Insufficient documentation

## 2023-12-06 DIAGNOSIS — R1110 Vomiting, unspecified: Secondary | ICD-10-CM | POA: Diagnosis present

## 2023-12-06 LAB — COMPREHENSIVE METABOLIC PANEL WITH GFR
ALT: 31 U/L (ref 0–44)
AST: 24 U/L (ref 15–41)
Albumin: 3.9 g/dL (ref 3.5–5.0)
Alkaline Phosphatase: 66 U/L (ref 38–126)
Anion gap: 10 (ref 5–15)
BUN: 14 mg/dL (ref 6–20)
CO2: 23 mmol/L (ref 22–32)
Calcium: 9.2 mg/dL (ref 8.9–10.3)
Chloride: 106 mmol/L (ref 98–111)
Creatinine, Ser: 1.1 mg/dL (ref 0.61–1.24)
GFR, Estimated: >60 mL/min (ref >60)
Glucose, Bld: 93 mg/dL (ref 70–99)
Potassium: 3.5 mmol/L (ref 3.5–5.1)
Sodium: 139 mmol/L (ref 135–145)
Total Bilirubin: 0.5 mg/dL (ref 0.0–1.2)
Total Protein: 7 g/dL (ref 6.5–8.1)

## 2023-12-06 LAB — URINALYSIS, ROUTINE W REFLEX MICROSCOPIC
Bilirubin Urine: NEGATIVE
Glucose, UA: NEGATIVE mg/dL
Hgb urine dipstick: NEGATIVE
Ketones, ur: NEGATIVE mg/dL
Leukocytes,Ua: NEGATIVE
Nitrite: NEGATIVE
Protein, ur: NEGATIVE mg/dL
Specific Gravity, Urine: 1.024 (ref 1.005–1.030)
pH: 5 (ref 5.0–8.0)

## 2023-12-06 LAB — CBC
HCT: 40 % (ref 39.0–52.0)
Hemoglobin: 13.3 g/dL (ref 13.0–17.0)
MCH: 30.8 pg (ref 26.0–34.0)
MCHC: 33.3 g/dL (ref 30.0–36.0)
MCV: 92.6 fL (ref 80.0–100.0)
Platelets: 240 10*3/uL (ref 150–400)
RBC: 4.32 MIL/uL (ref 4.22–5.81)
RDW: 15 % (ref 11.5–15.5)
WBC: 6.9 10*3/uL (ref 4.0–10.5)
nRBC: 0 % (ref 0.0–0.2)

## 2023-12-06 LAB — LIPASE, BLOOD: Lipase: 38 U/L (ref 11–51)

## 2023-12-06 MED ORDER — ONDANSETRON 4 MG PO TBDP: 4.0000 mg | ORAL_TABLET | Freq: Three times a day (TID) | ORAL | 0 refills | Status: AC | PRN

## 2023-12-06 NOTE — Discharge Instructions (Signed)
 As discussed, your labs are unremarkable.  I have sent a prescription of Zofran  to your pharmacy can take every 8 hours as needed for nausea/vomiting.  Follow-up with your primary care as scheduled for reevaluation of your symptoms as well as your anxiety.  Get help right away if: You have pain in your chest, neck, arm, or jaw. You feel very weak or you faint. You vomit again and again. You have vomit that is bright red or looks like black coffee grounds. You have bloody or black poop (stools) or poop that looks like tar. You have a very bad headache, a stiff neck, or both. You have very bad pain, cramping, or bloating in your belly (abdomen). You have trouble breathing. You are breathing very quickly. Your heart is beating very quickly. Your skin feels cold and clammy. You feel confused. You have signs of losing too much water in your body, such as: Dark pee, very little pee, or no pee. Cracked lips. Dry mouth. Sunken eyes. Sleepiness. Weakness.

## 2023-12-06 NOTE — ED Provider Notes (Signed)
 [place] EMERGENCY DEPARTMENT AT [facility] Provider Note   CSN: [id number] Arrival date & time: 12/06/23  0050     Patient presents with: Diarrhea   Edwin Palmer is a 43 y.o. male with a history of tobacco use presents the ED today for diarrhea and vomiting.  Patient reports daily diarrhea for the past week with 1 episode of vomiting yesterday.  He has been able to tolerate food and drink by mouth at home.  Denies any fevers, abdominal pain, or changes to urinary habits.  Denies any new foods, alcohol use, or marijuana use.  Family advised patient to come to the ED due to his increased anxiety as well.  No chest pain shortness of breath.  No additional complaints or concerns at this time.   Prior to Admission medications   Medication Sig Start Date End Date Taking? Authorizing Provider  ondansetron  (ZOFRAN -ODT) 4 MG disintegrating tablet Take 1 tablet (4 mg total) by mouth every 8 (eight) hours as needed for nausea or vomiting. 12/06/23 01/05/24 Yes Sonnie Dusky, PA-C  amoxicillin -clavulanate (AUGMENTIN ) 875-125 MG tablet Take 1 tablet by mouth 2 (two) times daily. 12/17/18   Gherghe, Costin M, MD  Camphor-Menthol-Methyl Sal (SALONPAS ) 3.06-26-08 % PTCH Apply 1 patch topically 2 (two) times daily. 03/01/23   Dorenda Gandy, MD  lidocaine  (LIDODERM ) 5 % Place 1 patch onto the skin daily. Remove & Discard patch within 12 hours or as directed by MD 07/12/23   Henderly, Britni A, PA-C  methocarbamol  (ROBAXIN ) 500 MG tablet Take 1 tablet (500 mg total) by mouth 2 (two) times daily. 07/12/23   Henderly, Britni A, PA-C  naproxen  (NAPROSYN ) 500 MG tablet Take 1 tablet (500 mg total) by mouth 2 (two) times daily. 07/12/23   Henderly, Britni A, PA-C  predniSONE  (DELTASONE ) 20 MG tablet Take 2 tablets (40 mg total) by mouth daily with breakfast. For the next four days 03/01/23   Dorenda Gandy, MD    Allergies: Patient has no known allergies.    Review of Systems  Gastrointestinal:   Positive for diarrhea.  All other systems reviewed and are negative.   Updated Vital Signs BP 120/75   Pulse 69   Temp 97.8 F (36.6 C)   Resp 17   Ht 5' 7 (1.702 m)   Wt 106 kg   SpO2 100%   BMI 36.60 kg/m   Physical Exam Vitals and nursing note reviewed.  Constitutional:      General: He is not in acute distress.    Appearance: Normal appearance.  HENT:     Head: Normocephalic and atraumatic.     Mouth/Throat:     Mouth: Mucous membranes are moist.   Eyes:     Conjunctiva/sclera: Conjunctivae normal.     Pupils: Pupils are equal, round, and reactive to light.    Cardiovascular:     Rate and Rhythm: Normal rate and regular rhythm.     Pulses: Normal pulses.     Heart sounds: Normal heart sounds.  Pulmonary:     Effort: Pulmonary effort is normal.     Breath sounds: Normal breath sounds.  Abdominal:     Palpations: Abdomen is soft.     Tenderness: There is no abdominal tenderness. There is no right CVA tenderness or left CVA tenderness.   Musculoskeletal:        General: Normal range of motion.     Cervical back: Normal range of motion.   Skin:    General: Skin is warm  and dry.     Findings: No rash.   Neurological:     General: No focal deficit present.     Mental Status: He is alert.     Sensory: No sensory deficit.     Motor: No weakness.   Psychiatric:        Mood and Affect: Mood normal.        Behavior: Behavior normal.    (all labs ordered are listed, but only abnormal results are displayed) Labs Reviewed  LIPASE, BLOOD  COMPREHENSIVE METABOLIC PANEL WITH GFR  CBC  URINALYSIS, ROUTINE W REFLEX MICROSCOPIC    EKG: None  Radiology: No results found.   Procedures   Medications Ordered in the ED - No data to display                                  Medical Decision Making Amount and/or Complexity of Data Reviewed Labs: ordered.  Risk Prescription drug management.   This patient presents to the ED for concern of vomiting  and diarrhea, this involves an extensive number of treatment options, and is a complaint that carries with it a high risk of complications and morbidity.   Differential diagnosis includes: IBS, IBD, pancreatitis, gastroenteritis, colitis, bowel obstruction, etc.   Comorbidities  See HPI above   Additional History  Additional history obtained from prior records   Lab Tests  I ordered and personally interpreted labs.  The pertinent results include:   CMP, lipase, and CBC are unremarkable UA shows no signs of infection   Problem List / ED Course / Critical Interventions / Medication Management  Patient came to the ED today at the request of his family because he has been having trouble sleeping at night due to his increased anxiety.  He also is been having diarrhea every day for the past week or so with 1 episode of vomiting yesterday.  No associate abdominal pains, changes to urinary habits, or fevers.  He is still able to eat and drink as normal.  Denies any new foods, alcohol use, or marijuana use prior to onset of symptoms. Patient reports that he has an appointment to establish with a primary care in 2 weeks. At the time of evaluation patient does not feel nauseous or having any abdominal pain.  He is resting comfortably in bed.  Prescription of Zofran  sent to the pharmacy for patient to use as needed for symptoms.   Social Determinants of Health  Access to healthcare   Test / Admission - Considered  Discussed findings with patient.  All questions answered. He is stable and safe for discharge home. Return precautions given.    Final diagnoses:  Gastroenteritis    ED Discharge Orders          Ordered    ondansetron  (ZOFRAN -ODT) 4 MG disintegrating tablet  Every 8 hours PRN        12/06/23 0641               Sonnie Dusky, PA-C 12/06/23 5784    Earma Gloss, MD 12/07/23 [phone]
